# Patient Record
Sex: Male | Born: 1935 | Race: White | Hispanic: No | Smoking: Former smoker
Health system: Southern US, Community
[De-identification: ages and names within clinical notes are randomized; demographics above are authoritative.]

## PROBLEM LIST (undated history)

## (undated) DIAGNOSIS — H409 Unspecified glaucoma: Secondary | ICD-10-CM

## (undated) DIAGNOSIS — F32A Depression, unspecified: Secondary | ICD-10-CM

## (undated) DIAGNOSIS — I10 Essential (primary) hypertension: Secondary | ICD-10-CM

## (undated) DIAGNOSIS — E119 Type 2 diabetes mellitus without complications: Secondary | ICD-10-CM

## (undated) DIAGNOSIS — N400 Enlarged prostate without lower urinary tract symptoms: Secondary | ICD-10-CM

## (undated) DIAGNOSIS — D638 Anemia in other chronic diseases classified elsewhere: Secondary | ICD-10-CM

## (undated) DIAGNOSIS — R32 Unspecified urinary incontinence: Secondary | ICD-10-CM

## (undated) DIAGNOSIS — F329 Major depressive disorder, single episode, unspecified: Secondary | ICD-10-CM

## (undated) DIAGNOSIS — E785 Hyperlipidemia, unspecified: Secondary | ICD-10-CM

## (undated) DIAGNOSIS — E663 Overweight: Secondary | ICD-10-CM

## (undated) DIAGNOSIS — J189 Pneumonia, unspecified organism: Secondary | ICD-10-CM

## (undated) DIAGNOSIS — I509 Heart failure, unspecified: Secondary | ICD-10-CM

## (undated) DIAGNOSIS — R918 Other nonspecific abnormal finding of lung field: Secondary | ICD-10-CM

## (undated) DIAGNOSIS — C61 Malignant neoplasm of prostate: Secondary | ICD-10-CM

## (undated) DIAGNOSIS — I2510 Atherosclerotic heart disease of native coronary artery without angina pectoris: Secondary | ICD-10-CM

## (undated) DIAGNOSIS — I4891 Unspecified atrial fibrillation: Secondary | ICD-10-CM

## (undated) DIAGNOSIS — E039 Hypothyroidism, unspecified: Secondary | ICD-10-CM

## (undated) HISTORY — DX: Type 2 diabetes mellitus without complications: E11.9

## (undated) HISTORY — PX: REPLACEMENT TOTAL KNEE: SUR1224

## (undated) HISTORY — PX: PENILE PROSTHESIS PLACEMENT: SHX739

## (undated) HISTORY — DX: Unspecified urinary incontinence: R32

## (undated) HISTORY — DX: Overweight: E66.3

## (undated) HISTORY — PX: OTHER SURGICAL HISTORY: SHX169

## (undated) HISTORY — DX: Benign prostatic hyperplasia without lower urinary tract symptoms: N40.0

## (undated) HISTORY — PX: PENILE PROSTHESIS  REMOVAL: SHX2202

---

## 2003-07-29 ENCOUNTER — Other Ambulatory Visit: Payer: Self-pay

## 2004-11-15 ENCOUNTER — Ambulatory Visit: Payer: Self-pay | Admitting: Orthopedic Surgery

## 2004-12-10 ENCOUNTER — Emergency Department: Payer: Self-pay | Admitting: Emergency Medicine

## 2004-12-26 ENCOUNTER — Encounter: Payer: Self-pay | Admitting: Orthopaedic Surgery

## 2005-01-21 ENCOUNTER — Encounter: Payer: Self-pay | Admitting: Orthopaedic Surgery

## 2005-12-11 ENCOUNTER — Inpatient Hospital Stay: Payer: Self-pay | Admitting: Internal Medicine

## 2005-12-11 ENCOUNTER — Other Ambulatory Visit: Payer: Self-pay

## 2006-02-20 ENCOUNTER — Encounter: Payer: Self-pay | Admitting: Orthopedic Surgery

## 2006-02-21 ENCOUNTER — Encounter: Payer: Self-pay | Admitting: Orthopedic Surgery

## 2006-03-29 ENCOUNTER — Emergency Department: Payer: Self-pay | Admitting: Emergency Medicine

## 2006-04-12 ENCOUNTER — Encounter: Payer: Self-pay | Admitting: Orthopedic Surgery

## 2006-04-23 ENCOUNTER — Encounter: Payer: Self-pay | Admitting: Orthopedic Surgery

## 2006-05-24 ENCOUNTER — Encounter: Payer: Self-pay | Admitting: Orthopedic Surgery

## 2006-06-22 ENCOUNTER — Encounter: Payer: Self-pay | Admitting: Orthopedic Surgery

## 2007-11-12 ENCOUNTER — Ambulatory Visit: Payer: Self-pay | Admitting: Pain Medicine

## 2007-12-22 ENCOUNTER — Ambulatory Visit: Payer: Self-pay | Admitting: Pain Medicine

## 2007-12-30 ENCOUNTER — Ambulatory Visit: Payer: Self-pay | Admitting: Pain Medicine

## 2008-01-19 ENCOUNTER — Ambulatory Visit: Payer: Self-pay | Admitting: Pain Medicine

## 2008-02-26 ENCOUNTER — Encounter: Payer: Self-pay | Admitting: Internal Medicine

## 2008-03-02 ENCOUNTER — Ambulatory Visit: Payer: Self-pay | Admitting: Pain Medicine

## 2008-08-09 ENCOUNTER — Ambulatory Visit: Payer: Self-pay | Admitting: Pain Medicine

## 2008-08-24 ENCOUNTER — Ambulatory Visit: Payer: Self-pay | Admitting: Pain Medicine

## 2009-08-29 ENCOUNTER — Ambulatory Visit: Payer: Self-pay | Admitting: Specialist

## 2009-08-30 ENCOUNTER — Ambulatory Visit: Payer: Self-pay | Admitting: Pain Medicine

## 2009-09-07 ENCOUNTER — Inpatient Hospital Stay: Payer: Self-pay | Admitting: Specialist

## 2009-09-13 ENCOUNTER — Encounter: Payer: Self-pay | Admitting: Internal Medicine

## 2009-09-21 ENCOUNTER — Encounter: Payer: Self-pay | Admitting: Internal Medicine

## 2009-10-04 ENCOUNTER — Encounter: Payer: Self-pay | Admitting: Specialist

## 2009-10-21 ENCOUNTER — Encounter: Payer: Self-pay | Admitting: Specialist

## 2009-10-21 ENCOUNTER — Encounter: Payer: Self-pay | Admitting: Internal Medicine

## 2009-10-31 ENCOUNTER — Inpatient Hospital Stay: Payer: Self-pay | Admitting: Internal Medicine

## 2009-11-21 ENCOUNTER — Encounter: Payer: Self-pay | Admitting: Internal Medicine

## 2009-12-22 ENCOUNTER — Encounter: Payer: Self-pay | Admitting: Internal Medicine

## 2010-01-06 ENCOUNTER — Inpatient Hospital Stay: Payer: Self-pay | Admitting: Internal Medicine

## 2010-01-17 ENCOUNTER — Ambulatory Visit: Payer: Self-pay

## 2010-01-21 ENCOUNTER — Encounter: Payer: Self-pay | Admitting: Internal Medicine

## 2010-02-02 ENCOUNTER — Ambulatory Visit: Payer: Self-pay

## 2010-02-21 ENCOUNTER — Encounter: Payer: Self-pay | Admitting: Internal Medicine

## 2010-03-23 ENCOUNTER — Encounter: Payer: Self-pay | Admitting: Internal Medicine

## 2010-04-23 ENCOUNTER — Encounter: Payer: Self-pay | Admitting: Internal Medicine

## 2010-05-24 ENCOUNTER — Encounter: Payer: Self-pay | Admitting: Internal Medicine

## 2011-07-27 ENCOUNTER — Ambulatory Visit: Payer: Self-pay | Admitting: Oncology

## 2011-07-27 LAB — RETICULOCYTES
Absolute Retic Count: 0.038 10*6/uL (ref 0.024–0.084)
Reticulocyte: 1.2 % (ref 0.5–1.5)

## 2011-07-27 LAB — IRON AND TIBC
Iron Bind.Cap.(Total): 297 ug/dL (ref 250–450)
Iron: 91 ug/dL (ref 65–175)
Unbound Iron-Bind.Cap.: 206 ug/dL

## 2011-07-27 LAB — CBC CANCER CENTER
Eosinophil %: 6.6 %
HCT: 29.4 % — ABNORMAL LOW (ref 40.0–52.0)
HGB: 10.2 g/dL — ABNORMAL LOW (ref 13.0–18.0)
Lymphocyte #: 1.8 x10 3/mm (ref 1.0–3.6)
MCHC: 34.7 g/dL (ref 32.0–36.0)
MCV: 97 fL (ref 80–100)
Monocyte #: 0.1 x10 3/mm (ref 0.0–0.7)
Neutrophil #: 1.6 x10 3/mm (ref 1.4–6.5)
Platelet: 198 x10 3/mm (ref 150–440)
RDW: 14.7 % — ABNORMAL HIGH (ref 11.5–14.5)
WBC: 3.8 x10 3/mm (ref 3.8–10.6)

## 2011-07-27 LAB — FOLATE: Folic Acid: 15.9 ng/mL (ref 3.1–100.0)

## 2011-07-27 LAB — FERRITIN: Ferritin (ARMC): 275 ng/mL (ref 8–388)

## 2011-07-27 LAB — LACTATE DEHYDROGENASE: LDH: 147 U/L (ref 87–241)

## 2011-08-17 LAB — CBC CANCER CENTER
Basophil #: 0 x10 3/mm (ref 0.0–0.1)
Basophil %: 0.5 %
HCT: 30.7 % — ABNORMAL LOW (ref 40.0–52.0)
HGB: 10.4 g/dL — ABNORMAL LOW (ref 13.0–18.0)
Lymphocyte #: 2.4 x10 3/mm (ref 1.0–3.6)
Lymphocyte %: 63.6 %
Monocyte #: 0.2 x10 3/mm (ref 0.2–1.0)
Monocyte %: 4.3 %
Neutrophil %: 27.8 %
Platelet: 147 x10 3/mm — ABNORMAL LOW (ref 150–440)
RDW: 14.9 % — ABNORMAL HIGH (ref 11.5–14.5)
WBC: 3.7 x10 3/mm — ABNORMAL LOW (ref 3.8–10.6)

## 2011-08-22 ENCOUNTER — Ambulatory Visit: Payer: Self-pay | Admitting: Oncology

## 2011-12-05 ENCOUNTER — Ambulatory Visit: Payer: Self-pay | Admitting: Pain Medicine

## 2012-01-03 ENCOUNTER — Ambulatory Visit: Payer: Self-pay | Admitting: Oncology

## 2012-01-03 LAB — CBC CANCER CENTER
Basophil #: 0 x10 3/mm (ref 0.0–0.1)
HCT: 30.2 % — ABNORMAL LOW (ref 40.0–52.0)
Lymphocyte #: 2.2 x10 3/mm (ref 1.0–3.6)
MCH: 33.7 pg (ref 26.0–34.0)
MCV: 100 fL (ref 80–100)
Monocyte #: 0.2 x10 3/mm (ref 0.2–1.0)
Monocyte %: 5 %
Platelet: 174 x10 3/mm (ref 150–440)
RDW: 13.6 % (ref 11.5–14.5)

## 2012-01-22 ENCOUNTER — Ambulatory Visit: Payer: Self-pay | Admitting: Oncology

## 2012-04-23 ENCOUNTER — Ambulatory Visit: Payer: Self-pay | Admitting: Oncology

## 2012-04-30 LAB — COMPREHENSIVE METABOLIC PANEL
Albumin: 3.2 g/dL — ABNORMAL LOW (ref 3.4–5.0)
Alkaline Phosphatase: 151 U/L — ABNORMAL HIGH (ref 50–136)
Co2: 24 mmol/L (ref 21–32)
Osmolality: 275 (ref 275–301)
Potassium: 4.6 mmol/L (ref 3.5–5.1)
SGOT(AST): 27 U/L (ref 15–37)
Total Protein: 7.4 g/dL (ref 6.4–8.2)

## 2012-04-30 LAB — CBC WITH DIFFERENTIAL/PLATELET
Basophil #: 0.1 10*3/uL (ref 0.0–0.1)
Basophil %: 2.9 %
Eosinophil #: 0.2 10*3/uL (ref 0.0–0.7)
Eosinophil %: 4.1 %
MCHC: 33.2 g/dL (ref 32.0–36.0)
MCV: 100 fL (ref 80–100)
Monocyte #: 0.2 x10 3/mm (ref 0.2–1.0)
RBC: 2.44 10*6/uL — ABNORMAL LOW (ref 4.40–5.90)

## 2012-04-30 LAB — CK TOTAL AND CKMB (NOT AT ARMC): CK, Total: 30 U/L — ABNORMAL LOW (ref 35–232)

## 2012-04-30 LAB — TROPONIN I: Troponin-I: <0.02 ng/mL

## 2012-05-01 ENCOUNTER — Observation Stay: Payer: Self-pay | Admitting: Student

## 2012-05-01 LAB — RETICULOCYTES: Reticulocyte: 1.8 % (ref 0.7–2.5)

## 2012-05-01 LAB — IRON AND TIBC
Iron Bind.Cap.(Total): 251 ug/dL (ref 250–450)
Iron Saturation: 10 %
Iron: 25 ug/dL — ABNORMAL LOW (ref 65–175)
Unbound Iron-Bind.Cap.: 226 ug/dL

## 2012-05-01 LAB — OCCULT BLOOD X 1 CARD TO LAB, STOOL: Occult Blood, Feces: NEGATIVE

## 2012-05-02 LAB — PSA: PSA: <0.1 ng/mL (ref 0.0–4.0)

## 2012-05-08 ENCOUNTER — Inpatient Hospital Stay: Payer: Self-pay | Admitting: Internal Medicine

## 2012-05-08 LAB — FOLATE: Folic Acid: 13.9 ng/mL (ref 3.1–100.0)

## 2012-05-08 LAB — CBC WITH DIFFERENTIAL/PLATELET
Basophil %: 0.8 %
Eosinophil %: 6.9 %
HGB: 7.4 g/dL — ABNORMAL LOW (ref 13.0–18.0)
Lymphocyte #: 1.5 10*3/uL (ref 1.0–3.6)
MCV: 100 fL (ref 80–100)
Monocyte %: 4.1 %
Neutrophil #: 2.2 10*3/uL (ref 1.4–6.5)
Neutrophil %: 52.5 %
Platelet: 179 10*3/uL (ref 150–440)
RBC: 2.16 10*6/uL — ABNORMAL LOW (ref 4.40–5.90)
RDW: 13.3 % (ref 11.5–14.5)
WBC: 4.2 10*3/uL (ref 3.8–10.6)

## 2012-05-08 LAB — URINALYSIS, COMPLETE
Bacteria: NONE SEEN
Blood: NEGATIVE
Ketone: NEGATIVE
Nitrite: NEGATIVE
Protein: NEGATIVE
Squamous Epithelial: NONE SEEN
WBC UR: <1 /HPF (ref 0–5)

## 2012-05-08 LAB — PROTIME-INR: INR: 1.2

## 2012-05-08 LAB — BASIC METABOLIC PANEL
Anion Gap: 7 (ref 7–16)
Calcium, Total: 8.5 mg/dL (ref 8.5–10.1)
Chloride: 104 mmol/L (ref 98–107)
Co2: 26 mmol/L (ref 21–32)
Creatinine: 1.33 mg/dL — ABNORMAL HIGH (ref 0.60–1.30)
EGFR (African American): 55 — ABNORMAL LOW
Glucose: 109 mg/dL — ABNORMAL HIGH (ref 65–99)
Osmolality: 280 (ref 275–301)
Potassium: 4.5 mmol/L (ref 3.5–5.1)

## 2012-05-08 LAB — RETICULOCYTES: Reticulocyte: 1.6 % (ref 0.7–2.5)

## 2012-05-08 LAB — IRON AND TIBC
Iron Bind.Cap.(Total): 207 ug/dL — ABNORMAL LOW (ref 250–450)
Iron Saturation: 11 %
Unbound Iron-Bind.Cap.: 185 ug/dL

## 2012-05-08 LAB — LACTATE DEHYDROGENASE: LDH: 146 U/L (ref 85–241)

## 2012-05-09 LAB — CBC WITH DIFFERENTIAL/PLATELET
Basophil #: 0 10*3/uL (ref 0.0–0.1)
Basophil %: 0.6 %
Eosinophil #: 0.2 10*3/uL (ref 0.0–0.7)
Eosinophil %: 6.6 %
HGB: 8.5 g/dL — ABNORMAL LOW (ref 13.0–18.0)
Lymphocyte %: 33.6 %
MCH: 34.3 pg — ABNORMAL HIGH (ref 26.0–34.0)
MCHC: 34.9 g/dL (ref 32.0–36.0)
MCV: 98 fL (ref 80–100)
Monocyte #: 0.2 x10 3/mm (ref 0.2–1.0)
Monocyte %: 4.7 %
Neutrophil %: 54.5 %
Platelet: 180 10*3/uL (ref 150–440)
RBC: 2.47 10*6/uL — ABNORMAL LOW (ref 4.40–5.90)

## 2012-05-09 LAB — BASIC METABOLIC PANEL
Anion Gap: 8 (ref 7–16)
Calcium, Total: 8.6 mg/dL (ref 8.5–10.1)
Co2: 24 mmol/L (ref 21–32)
Creatinine: 1.13 mg/dL (ref 0.60–1.30)
EGFR (African American): >60
Glucose: 106 mg/dL — ABNORMAL HIGH (ref 65–99)
Osmolality: 273 (ref 275–301)
Potassium: 4.1 mmol/L (ref 3.5–5.1)
Sodium: 134 mmol/L — ABNORMAL LOW (ref 136–145)

## 2012-05-09 LAB — APTT: Activated PTT: 42.3 secs — ABNORMAL HIGH (ref 23.6–35.9)

## 2012-05-19 LAB — CBC CANCER CENTER
Basophil #: 0 x10 3/mm (ref 0.0–0.1)
Basophil %: 0.5 %
Eosinophil %: 3.3 %
HCT: 26.4 % — ABNORMAL LOW (ref 40.0–52.0)
Lymphocyte #: 1 x10 3/mm (ref 1.0–3.6)
Lymphocyte %: 24 %
MCV: 97 fL (ref 80–100)
Monocyte #: 0.2 x10 3/mm (ref 0.2–1.0)
Neutrophil %: 68.2 %
Platelet: 221 x10 3/mm (ref 150–440)
RDW: 14.2 % (ref 11.5–14.5)
WBC: 4.4 x10 3/mm (ref 3.8–10.6)

## 2012-05-19 LAB — BASIC METABOLIC PANEL
Anion Gap: 6 — ABNORMAL LOW (ref 7–16)
Calcium, Total: 8.8 mg/dL (ref 8.5–10.1)
Chloride: 97 mmol/L — ABNORMAL LOW (ref 98–107)
Co2: 29 mmol/L (ref 21–32)
Glucose: 126 mg/dL — ABNORMAL HIGH (ref 65–99)
Potassium: 4.1 mmol/L (ref 3.5–5.1)
Sodium: 132 mmol/L — ABNORMAL LOW (ref 136–145)

## 2012-05-19 LAB — IRON AND TIBC
Iron Bind.Cap.(Total): 202 ug/dL — ABNORMAL LOW (ref 250–450)
Iron Saturation: 10 %
Unbound Iron-Bind.Cap.: 182 ug/dL

## 2012-05-19 LAB — FERRITIN: Ferritin (ARMC): 718 ng/mL — ABNORMAL HIGH (ref 8–388)

## 2012-05-24 ENCOUNTER — Ambulatory Visit: Payer: Self-pay | Admitting: Oncology

## 2012-06-02 LAB — BASIC METABOLIC PANEL
Anion Gap: 9 (ref 7–16)
BUN: 23 mg/dL — ABNORMAL HIGH (ref 7–18)
Calcium, Total: 8.6 mg/dL (ref 8.5–10.1)
Chloride: 96 mmol/L — ABNORMAL LOW (ref 98–107)
Co2: 26 mmol/L (ref 21–32)
EGFR (African American): 58 — ABNORMAL LOW
Glucose: 100 mg/dL — ABNORMAL HIGH (ref 65–99)
Osmolality: 266 (ref 275–301)

## 2012-06-02 LAB — CBC CANCER CENTER
Basophil #: 0 x10 3/mm (ref 0.0–0.1)
Basophil %: 0.9 %
Eosinophil #: 0.2 x10 3/mm (ref 0.0–0.7)
Eosinophil %: 6.5 %
HCT: 24.3 % — ABNORMAL LOW (ref 40.0–52.0)
Lymphocyte %: 25.7 %
MCHC: 34.4 g/dL (ref 32.0–36.0)
Monocyte #: 0.2 x10 3/mm (ref 0.2–1.0)
Monocyte %: 5.3 %
Neutrophil %: 61.6 %
Platelet: 141 x10 3/mm — ABNORMAL LOW (ref 150–440)
RDW: 14.7 % — ABNORMAL HIGH (ref 11.5–14.5)
WBC: 3.6 x10 3/mm — ABNORMAL LOW (ref 3.8–10.6)

## 2012-06-21 ENCOUNTER — Ambulatory Visit: Payer: Self-pay | Admitting: Oncology

## 2012-06-30 LAB — CBC CANCER CENTER
Basophil #: 0 x10 3/mm (ref 0.0–0.1)
Basophil %: 0.2 %
Eosinophil #: 0 x10 3/mm (ref 0.0–0.7)
Eosinophil %: 0 %
HGB: 9.9 g/dL — ABNORMAL LOW (ref 13.0–18.0)
Lymphocyte #: 0.5 x10 3/mm — ABNORMAL LOW (ref 1.0–3.6)
MCH: 35.3 pg — ABNORMAL HIGH (ref 26.0–34.0)
MCHC: 33.8 g/dL (ref 32.0–36.0)
MCV: 104 fL — ABNORMAL HIGH (ref 80–100)
Monocyte #: 0.1 x10 3/mm — ABNORMAL LOW (ref 0.2–1.0)
Monocyte %: 2.2 %
Neutrophil #: 2.4 x10 3/mm (ref 1.4–6.5)
Neutrophil %: 80.8 %
Platelet: 158 x10 3/mm (ref 150–440)

## 2012-06-30 LAB — IRON AND TIBC
Iron Bind.Cap.(Total): 290 ug/dL (ref 250–450)
Unbound Iron-Bind.Cap.: 111 ug/dL

## 2012-06-30 LAB — FERRITIN: Ferritin (ARMC): 1179 ng/mL — ABNORMAL HIGH (ref 8–388)

## 2012-07-22 ENCOUNTER — Ambulatory Visit: Payer: Self-pay | Admitting: Oncology

## 2012-08-29 ENCOUNTER — Ambulatory Visit: Payer: Self-pay | Admitting: Oncology

## 2012-09-01 LAB — CBC CANCER CENTER
Basophil #: 0 x10 3/mm (ref 0.0–0.1)
Eosinophil #: 0 x10 3/mm (ref 0.0–0.7)
HGB: 10.2 g/dL — ABNORMAL LOW (ref 13.0–18.0)
MCH: 39.8 pg — ABNORMAL HIGH (ref 26.0–34.0)
MCHC: 34.9 g/dL (ref 32.0–36.0)
MCV: 114 fL — ABNORMAL HIGH (ref 80–100)
Neutrophil #: 2 x10 3/mm (ref 1.4–6.5)
Neutrophil %: 77.2 %
Platelet: 163 x10 3/mm (ref 150–440)
RBC: 2.56 10*6/uL — ABNORMAL LOW (ref 4.40–5.90)
RDW: 15.8 % — ABNORMAL HIGH (ref 11.5–14.5)
WBC: 2.5 x10 3/mm — ABNORMAL LOW (ref 3.8–10.6)

## 2012-09-01 LAB — IRON AND TIBC
Iron Bind.Cap.(Total): 302 ug/dL (ref 250–450)
Iron Saturation: 35 %
Unbound Iron-Bind.Cap.: 195 ug/dL

## 2012-09-01 LAB — FERRITIN: Ferritin (ARMC): 817 ng/mL — ABNORMAL HIGH (ref 8–388)

## 2012-09-21 ENCOUNTER — Ambulatory Visit: Payer: Self-pay | Admitting: Oncology

## 2012-11-21 ENCOUNTER — Ambulatory Visit: Payer: Self-pay | Admitting: Oncology

## 2012-12-02 LAB — CBC CANCER CENTER
Basophil #: 0 x10 3/mm (ref 0.0–0.1)
Basophil %: 0.7 %
Eosinophil #: 0 x10 3/mm (ref 0.0–0.7)
HGB: 11.1 g/dL — ABNORMAL LOW (ref 13.0–18.0)
Lymphocyte #: 0.6 x10 3/mm — ABNORMAL LOW (ref 1.0–3.6)
MCH: 40.1 pg — ABNORMAL HIGH (ref 26.0–34.0)
MCHC: 35.8 g/dL (ref 32.0–36.0)
Monocyte #: 0.1 x10 3/mm — ABNORMAL LOW (ref 0.2–1.0)
Neutrophil %: 83.8 %
Platelet: 199 x10 3/mm (ref 150–440)
RBC: 2.76 10*6/uL — ABNORMAL LOW (ref 4.40–5.90)
RDW: 13.5 % (ref 11.5–14.5)
WBC: 4.3 x10 3/mm (ref 3.8–10.6)

## 2012-12-02 LAB — IRON AND TIBC
Iron Bind.Cap.(Total): 282 ug/dL (ref 250–450)
Iron: 125 ug/dL (ref 65–175)

## 2012-12-03 ENCOUNTER — Ambulatory Visit: Payer: Self-pay | Admitting: Oncology

## 2012-12-22 ENCOUNTER — Ambulatory Visit: Payer: Self-pay | Admitting: Oncology

## 2013-02-04 ENCOUNTER — Ambulatory Visit: Payer: Self-pay | Admitting: Pain Medicine

## 2013-02-12 ENCOUNTER — Ambulatory Visit: Payer: Self-pay | Admitting: Pain Medicine

## 2013-03-23 ENCOUNTER — Ambulatory Visit: Payer: Self-pay | Admitting: Oncology

## 2013-03-23 LAB — CBC CANCER CENTER
Basophil #: 0 x10 3/mm (ref 0.0–0.1)
Basophil %: 0.7 %
Eosinophil #: 0 x10 3/mm (ref 0.0–0.7)
HCT: 33 % — ABNORMAL LOW (ref 40.0–52.0)
Lymphocyte %: 19.3 %
MCH: 37.2 pg — ABNORMAL HIGH (ref 26.0–34.0)
MCHC: 34.3 g/dL (ref 32.0–36.0)
Monocyte #: 0.1 x10 3/mm — ABNORMAL LOW (ref 0.2–1.0)
Neutrophil #: 2.7 x10 3/mm (ref 1.4–6.5)
Neutrophil %: 77.6 %
Platelet: 179 x10 3/mm (ref 150–440)
RBC: 3.05 10*6/uL — ABNORMAL LOW (ref 4.40–5.90)
RDW: 13.8 % (ref 11.5–14.5)
WBC: 3.5 x10 3/mm — ABNORMAL LOW (ref 3.8–10.6)

## 2013-03-23 LAB — HEPATIC FUNCTION PANEL A (ARMC)
Albumin: 3.3 g/dL — ABNORMAL LOW (ref 3.4–5.0)
Alkaline Phosphatase: 54 U/L
Bilirubin, Direct: 0.1 mg/dL (ref 0.00–0.20)
SGOT(AST): 13 U/L — ABNORMAL LOW (ref 15–37)
SGPT (ALT): 21 U/L (ref 12–78)
Total Protein: 6.5 g/dL (ref 6.4–8.2)

## 2013-03-23 LAB — FERRITIN: Ferritin (ARMC): 649 ng/mL — ABNORMAL HIGH (ref 8–388)

## 2013-04-23 ENCOUNTER — Ambulatory Visit: Payer: Self-pay | Admitting: Oncology

## 2013-07-22 ENCOUNTER — Emergency Department: Payer: Self-pay | Admitting: Emergency Medicine

## 2013-07-22 LAB — COMPREHENSIVE METABOLIC PANEL
ALK PHOS: 57 U/L
ANION GAP: 4 — AB (ref 7–16)
AST: 21 U/L (ref 15–37)
Albumin: 3.1 g/dL — ABNORMAL LOW (ref 3.4–5.0)
BILIRUBIN TOTAL: 0.6 mg/dL (ref 0.2–1.0)
BUN: 20 mg/dL — ABNORMAL HIGH (ref 7–18)
Calcium, Total: 8.8 mg/dL (ref 8.5–10.1)
Chloride: 102 mmol/L (ref 98–107)
Co2: 32 mmol/L (ref 21–32)
Creatinine: 1.05 mg/dL (ref 0.60–1.30)
EGFR (African American): >60
EGFR (Non-African Amer.): >60
Glucose: 130 mg/dL — ABNORMAL HIGH (ref 65–99)
Osmolality: 280 (ref 275–301)
Potassium: 3.8 mmol/L (ref 3.5–5.1)
SGPT (ALT): 21 U/L (ref 12–78)
Sodium: 138 mmol/L (ref 136–145)
Total Protein: 6.8 g/dL (ref 6.4–8.2)

## 2013-07-22 LAB — CBC
HCT: 32.1 % — ABNORMAL LOW (ref 40.0–52.0)
HGB: 10.8 g/dL — AB (ref 13.0–18.0)
MCH: 38.1 pg — AB (ref 26.0–34.0)
MCHC: 33.6 g/dL (ref 32.0–36.0)
MCV: 113 fL — AB (ref 80–100)
PLATELETS: 142 10*3/uL — AB (ref 150–440)
RBC: 2.83 10*6/uL — ABNORMAL LOW (ref 4.40–5.90)
RDW: 15 % — ABNORMAL HIGH (ref 11.5–14.5)
WBC: 3.7 10*3/uL — ABNORMAL LOW (ref 3.8–10.6)

## 2013-07-22 LAB — URINALYSIS, COMPLETE
BLOOD: NEGATIVE
Bacteria: NONE SEEN
Bilirubin,UR: NEGATIVE
Glucose,UR: NEGATIVE mg/dL (ref 0–75)
Ketone: NEGATIVE
Leukocyte Esterase: NEGATIVE
NITRITE: NEGATIVE
PH: 6 (ref 4.5–8.0)
Protein: NEGATIVE
SQUAMOUS EPITHELIAL: NONE SEEN
Specific Gravity: 1.016 (ref 1.003–1.030)
WBC UR: NONE SEEN /HPF (ref 0–5)

## 2013-07-22 LAB — CK TOTAL AND CKMB (NOT AT ARMC)
CK, TOTAL: 21 U/L — AB
CK-MB: <0.5 ng/mL — ABNORMAL LOW (ref 0.5–3.6)

## 2013-07-22 LAB — PROTIME-INR
INR: 1
Prothrombin Time: 13.3 secs (ref 11.5–14.7)

## 2013-07-22 LAB — TROPONIN I: Troponin-I: <0.02 ng/mL

## 2013-09-21 ENCOUNTER — Ambulatory Visit: Payer: Self-pay | Admitting: Internal Medicine

## 2013-10-01 ENCOUNTER — Inpatient Hospital Stay: Payer: Self-pay | Admitting: Internal Medicine

## 2013-10-01 LAB — COMPREHENSIVE METABOLIC PANEL
ALBUMIN: 3.1 g/dL — AB (ref 3.4–5.0)
ALK PHOS: 60 U/L
ALT: 18 U/L (ref 12–78)
Anion Gap: 8 (ref 7–16)
BILIRUBIN TOTAL: 1 mg/dL (ref 0.2–1.0)
BUN: 19 mg/dL — AB (ref 7–18)
CALCIUM: 9 mg/dL (ref 8.5–10.1)
CHLORIDE: 96 mmol/L — AB (ref 98–107)
CREATININE: 0.99 mg/dL (ref 0.60–1.30)
Co2: 28 mmol/L (ref 21–32)
GLUCOSE: 147 mg/dL — AB (ref 65–99)
Osmolality: 269 (ref 275–301)
Potassium: 3.4 mmol/L — ABNORMAL LOW (ref 3.5–5.1)
SGOT(AST): 16 U/L (ref 15–37)
Sodium: 132 mmol/L — ABNORMAL LOW (ref 136–145)
TOTAL PROTEIN: 6.7 g/dL (ref 6.4–8.2)

## 2013-10-01 LAB — URINALYSIS, COMPLETE
Bacteria: NONE SEEN
Bilirubin,UR: NEGATIVE
Blood: NEGATIVE
Glucose,UR: NEGATIVE mg/dL (ref 0–75)
KETONE: NEGATIVE
LEUKOCYTE ESTERASE: NEGATIVE
Nitrite: NEGATIVE
PROTEIN: NEGATIVE
Ph: 7 (ref 4.5–8.0)
RBC,UR: 3 /HPF (ref 0–5)
Specific Gravity: 1.015 (ref 1.003–1.030)
Squamous Epithelial: <1
WBC UR: <1 /HPF (ref 0–5)

## 2013-10-01 LAB — CBC
HCT: 29.4 % — AB (ref 40.0–52.0)
HGB: 10.1 g/dL — ABNORMAL LOW (ref 13.0–18.0)
MCH: 39.3 pg — ABNORMAL HIGH (ref 26.0–34.0)
MCHC: 34.3 g/dL (ref 32.0–36.0)
MCV: 115 fL — ABNORMAL HIGH (ref 80–100)
Platelet: 151 10*3/uL (ref 150–440)
RBC: 2.57 10*6/uL — ABNORMAL LOW (ref 4.40–5.90)
RDW: 15.9 % — ABNORMAL HIGH (ref 11.5–14.5)
WBC: 7.3 10*3/uL (ref 3.8–10.6)

## 2013-10-01 LAB — CK TOTAL AND CKMB (NOT AT ARMC)
CK, Total: 19 U/L — ABNORMAL LOW
CK-MB: <0.5 ng/mL — ABNORMAL LOW (ref 0.5–3.6)

## 2013-10-01 LAB — PRO B NATRIURETIC PEPTIDE: B-Type Natriuretic Peptide: 937 pg/mL — ABNORMAL HIGH (ref 0–450)

## 2013-10-01 LAB — TROPONIN I: Troponin-I: <0.02 ng/mL

## 2013-10-02 LAB — HEMOGLOBIN: HGB: 8.7 g/dL — AB (ref 13.0–18.0)

## 2013-10-02 LAB — CBC WITH DIFFERENTIAL/PLATELET
Basophil #: 0 10*3/uL (ref 0.0–0.1)
Basophil %: 0.4 %
Eosinophil #: 0 10*3/uL (ref 0.0–0.7)
Eosinophil %: 0 %
HCT: 24.3 % — ABNORMAL LOW (ref 40.0–52.0)
HGB: 8.5 g/dL — AB (ref 13.0–18.0)
LYMPHS PCT: 8.2 %
Lymphocyte #: 0.4 10*3/uL — ABNORMAL LOW (ref 1.0–3.6)
MCH: 40 pg — ABNORMAL HIGH (ref 26.0–34.0)
MCHC: 35 g/dL (ref 32.0–36.0)
MCV: 114 fL — ABNORMAL HIGH (ref 80–100)
MONOS PCT: 2.6 %
Monocyte #: 0.1 x10 3/mm — ABNORMAL LOW (ref 0.2–1.0)
NEUTROS PCT: 88.8 %
Neutrophil #: 4.3 10*3/uL (ref 1.4–6.5)
Platelet: 118 10*3/uL — ABNORMAL LOW (ref 150–440)
RBC: 2.13 10*6/uL — ABNORMAL LOW (ref 4.40–5.90)
RDW: 15.6 % — ABNORMAL HIGH (ref 11.5–14.5)
WBC: 4.9 10*3/uL (ref 3.8–10.6)

## 2013-10-02 LAB — BASIC METABOLIC PANEL
Anion Gap: 5 — ABNORMAL LOW (ref 7–16)
BUN: 20 mg/dL — AB (ref 7–18)
Calcium, Total: 8.3 mg/dL — ABNORMAL LOW (ref 8.5–10.1)
Chloride: 98 mmol/L (ref 98–107)
Co2: 29 mmol/L (ref 21–32)
Creatinine: 1.01 mg/dL (ref 0.60–1.30)
EGFR (African American): >60
EGFR (Non-African Amer.): >60
GLUCOSE: 151 mg/dL — AB (ref 65–99)
Osmolality: 270 (ref 275–301)
Potassium: 3.6 mmol/L (ref 3.5–5.1)
SODIUM: 132 mmol/L — AB (ref 136–145)

## 2013-10-02 LAB — PROTIME-INR
INR: 1.2
PROTHROMBIN TIME: 14.6 s (ref 11.5–14.7)

## 2013-10-02 LAB — SEDIMENTATION RATE

## 2013-10-03 LAB — BASIC METABOLIC PANEL
ANION GAP: 7 (ref 7–16)
BUN: 17 mg/dL (ref 7–18)
CHLORIDE: 97 mmol/L — AB (ref 98–107)
CO2: 28 mmol/L (ref 21–32)
Calcium, Total: 8.2 mg/dL — ABNORMAL LOW (ref 8.5–10.1)
Creatinine: 1.02 mg/dL (ref 0.60–1.30)
EGFR (African American): >60
EGFR (Non-African Amer.): >60
Glucose: 181 mg/dL — ABNORMAL HIGH (ref 65–99)
Osmolality: 271 (ref 275–301)
Potassium: 3.8 mmol/L (ref 3.5–5.1)
Sodium: 132 mmol/L — ABNORMAL LOW (ref 136–145)

## 2013-10-03 LAB — CBC WITH DIFFERENTIAL/PLATELET
BASOS ABS: 0 10*3/uL (ref 0.0–0.1)
Basophil %: 0.4 %
Eosinophil #: 0 10*3/uL (ref 0.0–0.7)
Eosinophil %: 0 %
HCT: 24.6 % — ABNORMAL LOW (ref 40.0–52.0)
HGB: 8.1 g/dL — ABNORMAL LOW (ref 13.0–18.0)
LYMPHS PCT: 14.5 %
Lymphocyte #: 0.6 10*3/uL — ABNORMAL LOW (ref 1.0–3.6)
MCH: 38.1 pg — ABNORMAL HIGH (ref 26.0–34.0)
MCHC: 33 g/dL (ref 32.0–36.0)
MCV: 115 fL — ABNORMAL HIGH (ref 80–100)
MONO ABS: 0.1 x10 3/mm — AB (ref 0.2–1.0)
Monocyte %: 2.5 %
NEUTROS PCT: 82.6 %
Neutrophil #: 3.3 10*3/uL (ref 1.4–6.5)
Platelet: 145 10*3/uL — ABNORMAL LOW (ref 150–440)
RBC: 2.14 10*6/uL — ABNORMAL LOW (ref 4.40–5.90)
RDW: 15.2 % — ABNORMAL HIGH (ref 11.5–14.5)
WBC: 4 10*3/uL (ref 3.8–10.6)

## 2013-10-03 LAB — PROTIME-INR
INR: 1.1
Prothrombin Time: 14.4 secs (ref 11.5–14.7)

## 2013-10-04 LAB — HEMOGLOBIN: HGB: 8.3 g/dL — ABNORMAL LOW (ref 13.0–18.0)

## 2013-10-05 LAB — VANCOMYCIN, TROUGH: Vancomycin, Trough: 12 ug/mL (ref 10–20)

## 2013-10-06 LAB — CULTURE, BLOOD (SINGLE)

## 2013-10-06 LAB — CREATININE, SERUM
CREATININE: 1.12 mg/dL (ref 0.60–1.30)
EGFR (Non-African Amer.): 58 — ABNORMAL LOW

## 2013-10-06 LAB — HEMOGLOBIN: HGB: 8.1 g/dL — AB (ref 13.0–18.0)

## 2013-10-06 LAB — OSMOLALITY: Osmolality: 280 mOsm/kg (ref 280–301)

## 2013-10-06 LAB — SODIUM: Sodium: 132 mmol/L — ABNORMAL LOW (ref 136–145)

## 2013-10-21 ENCOUNTER — Ambulatory Visit: Payer: Self-pay | Admitting: Internal Medicine

## 2013-12-08 ENCOUNTER — Encounter: Payer: Self-pay | Admitting: General Surgery

## 2013-12-22 ENCOUNTER — Encounter: Payer: Self-pay | Admitting: General Surgery

## 2014-01-21 ENCOUNTER — Encounter: Payer: Self-pay | Admitting: General Surgery

## 2014-04-24 IMAGING — CR DG CHEST 2V
1 series · 2 of 2 positions shown · non-contrast
Comparison: none

REASON FOR EXAM: sob
COMMENTS:

[Series 1: x chest ap · 0.14mm/px · 2 of 2 slices shown]
[im 1/2]
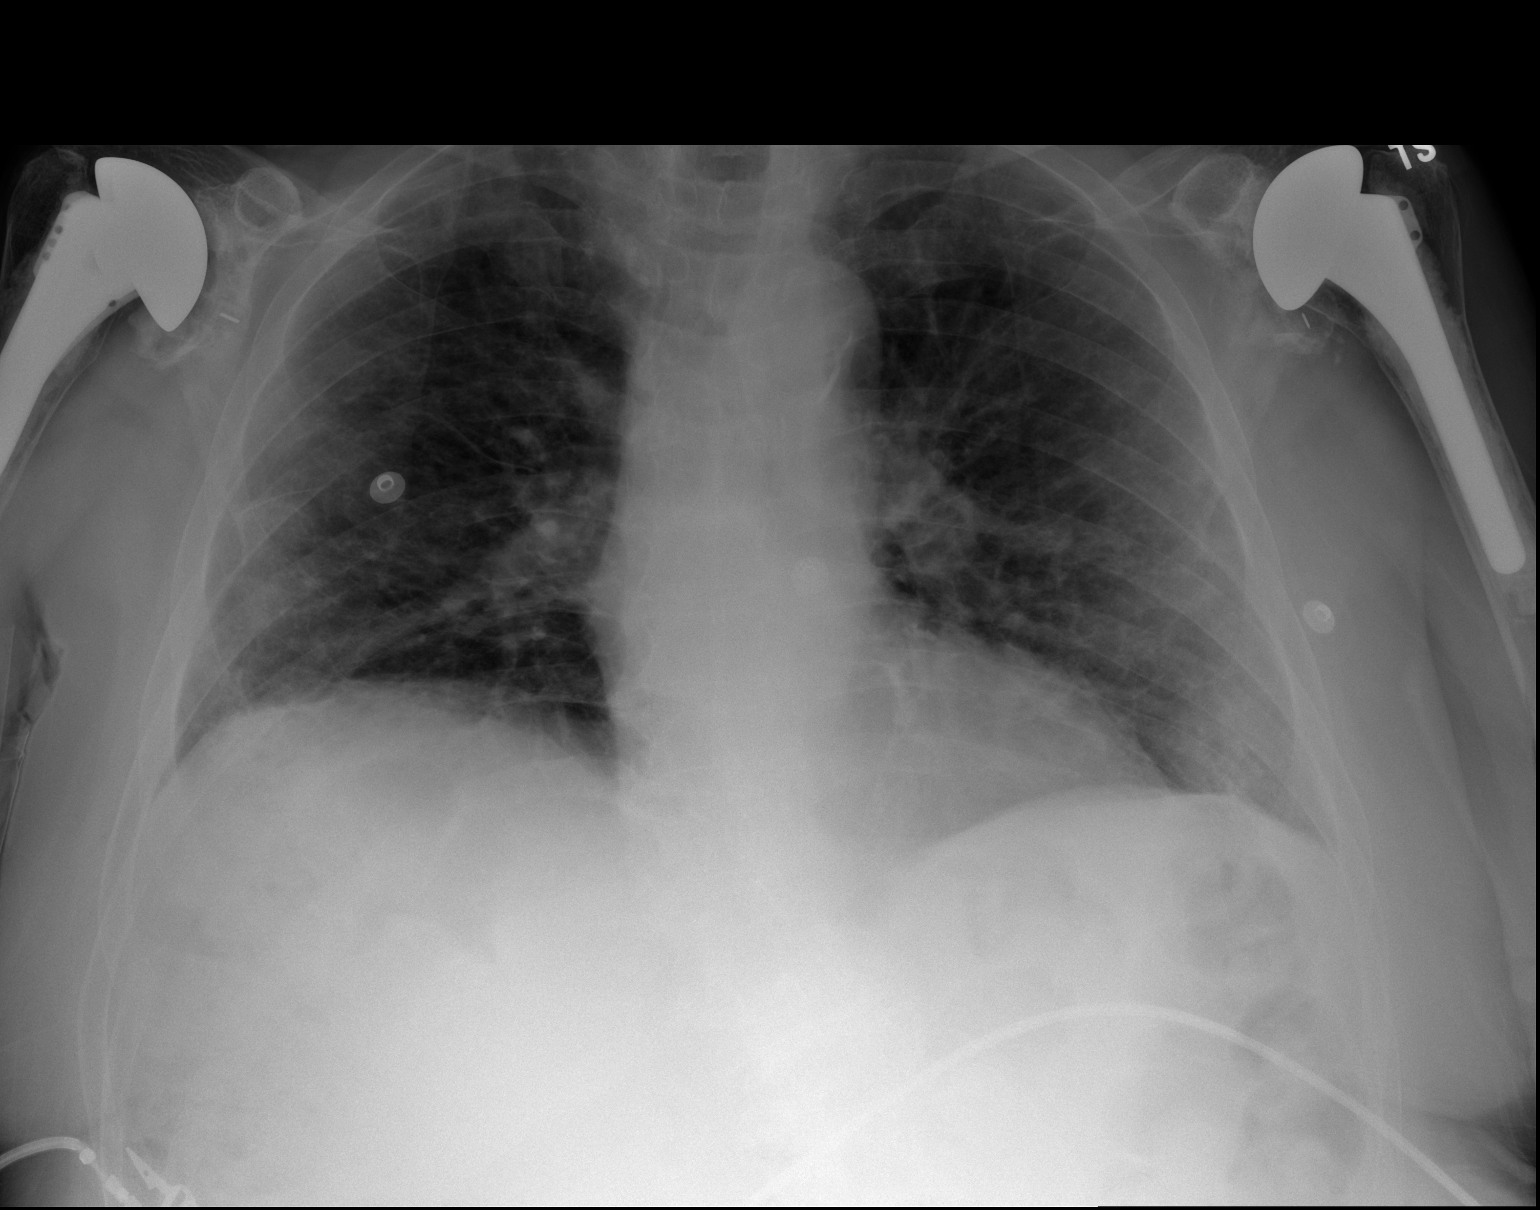
[im 2/2]
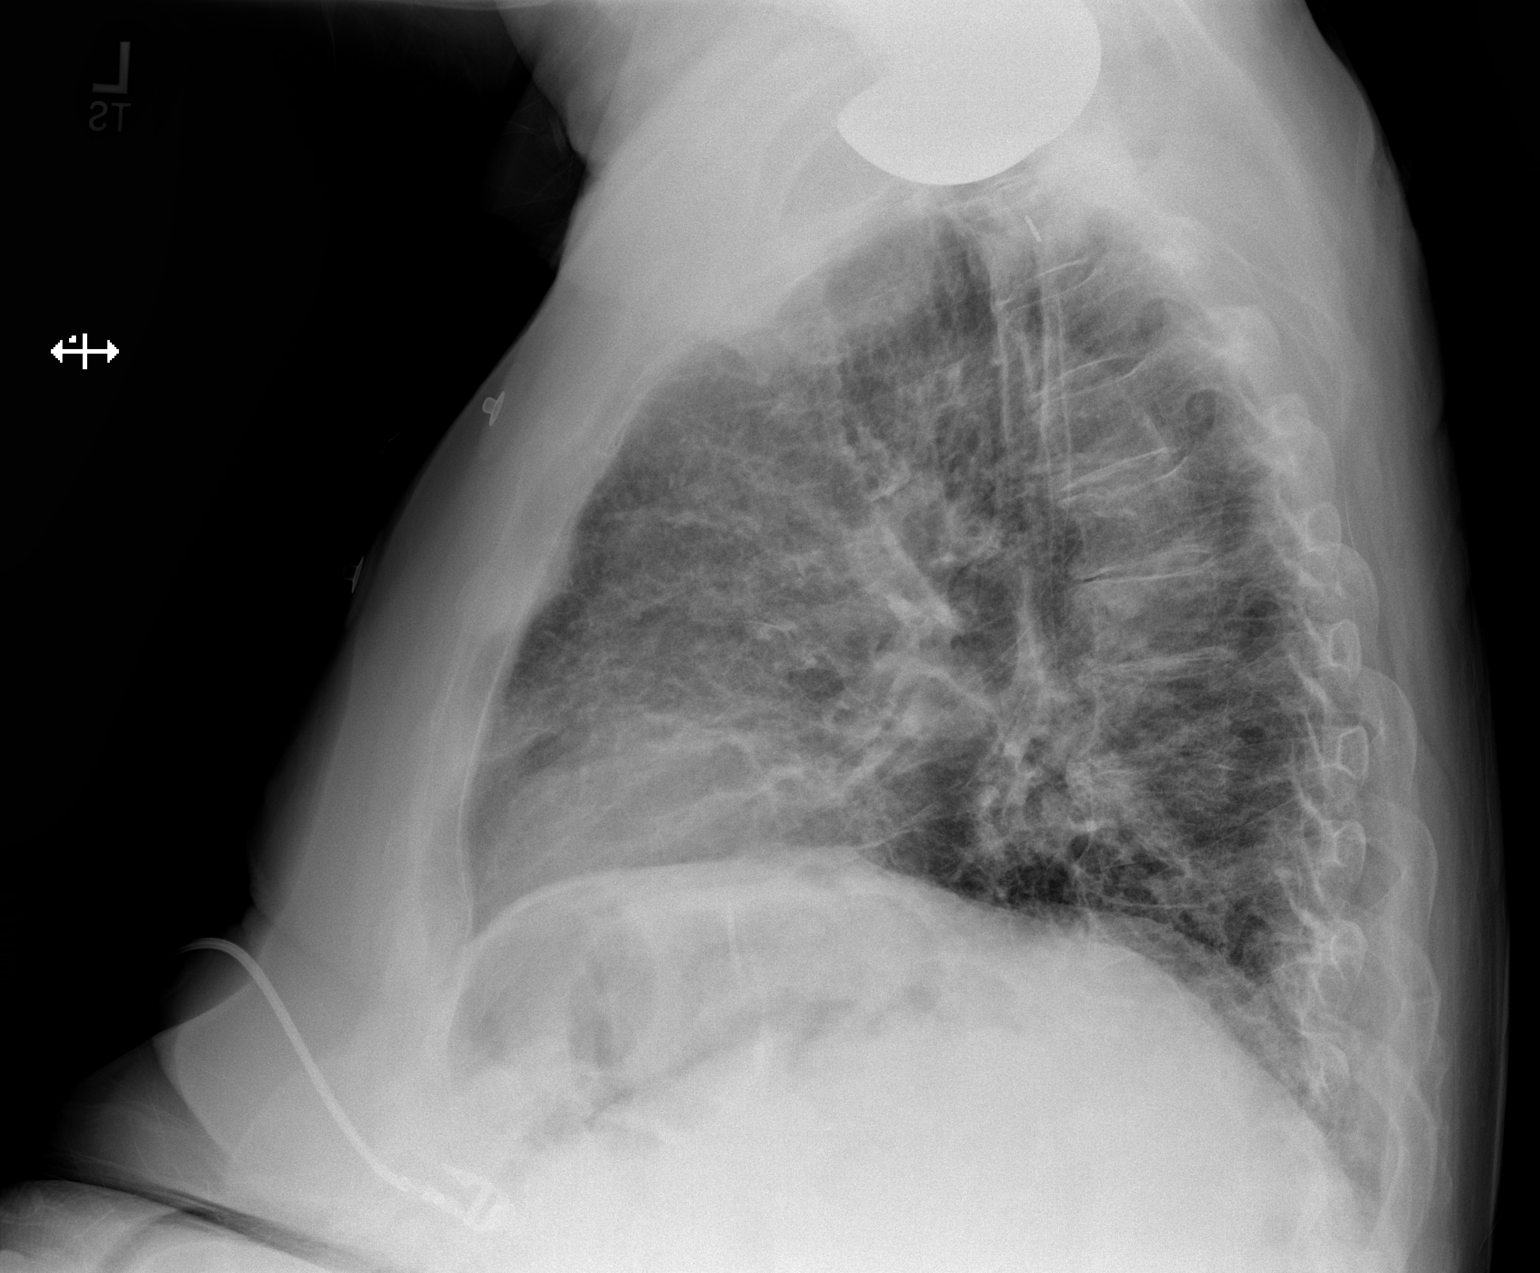

[2 of 2 positions shown; findings below may reference images not displayed]

PROCEDURE:     DXR - DXR CHEST PA (OR AP) AND LATERAL  - April 30, 2012 [DATE]

RESULT:     Comparison is made to the study 07 January, 2010. Bilateral
shoulder hemiarthroplasty changes are present. The interstitial markings are
diffusely prominent. There is escort atelectasis of the right lung base.
There is patchy increased density in the mid and lower left lung.
Interstitial markings are prominent. There is no significant effusion or
pneumothorax. The cardiac silhouette is normal. Atherosclerotic
calcification is present.
IMPRESSION: 1. Patchy areas of bilateral density which could represent infiltrate,
atelectasis and/or fibrosis. No effusion. No pneumothorax.

[REDACTED]

## 2014-08-13 NOTE — H&P (Signed)
PATIENT NAME:  Aaron Cook, FEBUS MR#:  [id number] DATE OF BIRTH:  1920/08/13  DATE OF ADMISSION:  05/08/2012  PRIMARY CARE PHYSICIAN:  Jerrell Belfast, MD.    CHIEF COMPLAINT: Sent in for hemoglobin of 6.6.   HISTORY OF PRESENT ILLNESS: This is an 81 year old man who has not been feeling well lately. He has been sleepy and tired, feeling lousy, weak and shaky. He has an appointment with Dr. Grayland Ormond on Monday, hematology. He was sent in for a low blood count. No chest pain, but positive for shortness of breath. The patient was guaiac-negative in the ER, and hemoglobin in the ER was 7.4. Hospitalist services were contacted for further evaluation.   PAST MEDICAL HISTORY: History of congestive heart failure, coronary artery disease, hypothyroidism, gastroesophageal reflux disease, osteoarthritis, chronic pain, hyperlipidemia, depression, history of prostate cancer.   PAST SURGICAL HISTORY: Bilateral shoulder replacement, left knee replacement, prostate seed implantation, cardiac ablation, cataracts.   ALLERGIES: ATROPINE AND SCOPOLAMINE.   MEDICATIONS: Include artificial tears one drop left eye 3 times a day, aspirin 81 mg daily, Celebrex 200 mg twice a day, doxepin 25 mg at bedtime, Flomax 0.4 mg daily, Flonase 2 sprays each nostril daily, guaifenesin p.r.n., Levaquin daily for 7 days, Lipitor 10 mg daily, Norco 5/325 every 4 hours as needed for pain, Paxil 20 mg daily, Prilosec 20 mg daily, Synthroid 137 mcg daily, Systane eye drops twice a day, Toprol-XL 12.5 mg daily, Tylenol p.r.n., vitamin B12 at 500 mcg 2 tablets daily, Xalatan one drop right eye at bedtime, Zyrtec 10 mg daily.   SOCIAL HISTORY: Lives at [facility]. Quit smoking 1967. Occasional alcohol. He used to work as a Automotive engineer at [employer], Tour manager.   FAMILY HISTORY: Father died at 34 of old age. Mother died at 42 of old age.   REVIEW OF SYSTEMS: CONSTITUTIONAL: Positive for fatigue. No fever, chills, or  sweats. No weight loss. No weight gain.  EYES: Does wear glasses.  EARS, NOSE, MOUTH AND THROAT: Decreased hearing. No sore throat. No difficulty swallowing.  CARDIOVASCULAR: No chest pain. No palpitations.  RESPIRATORY: Positive for shortness of breath. Positive for coughing. No sputum. No hemoptysis.  GASTROINTESTINAL: No nausea. No vomiting. No abdominal pain. No diarrhea. No constipation. No bright red blood per rectum. No melena.  GENITOURINARY: No burning on urination or hematuria.  MUSCULOSKELETAL: Positive for arthritis pain.  INTEGUMENT: Did have some lumps on the skin that disappeared.  NEUROLOGIC: No fainting or blackouts.  PSYCHIATRIC: On medication for depression.  ENDOCRINE: Positive for hypothyroidism.  HEMATOLOGIC AND LYMPHATIC: History of anemia.   PHYSICAL EXAMINATION: VITAL SIGNS: Temperature 98, pulse 88, respirations 16, blood pressure 148/65, pulse oximetry 97% on room air.  GENERAL: No respiratory distress.  EYES: Conjunctivae pale. Lids normal. Pupils equal, round and reactive to light. Extraocular muscles intact. No nystagmus.  EARS, NOSE, MOUTH AND THROAT: Tympanic membranes no erythema. Nasal mucosa: No erythema. Throat: No erythema. No exudate seen. Lips and gums: No lesions.  NECK: No JVD. No bruits. No lymphadenopathy. No thyromegaly. No thyroid nodules palpated.  RESPIRATORY: Lungs clear to auscultation. No use of accessory muscles to breathe. No rhonchi, rales or wheeze heard.  CARDIOVASCULAR: S1, S2 normal. Positive 2/6 systolic ejection murmur. Carotid upstroke 2+ bilaterally. No bruits.  EXTREMITIES: Dorsalis pedis pulses 2+ bilaterally, 2+ edema bilateral lower extremity.  ABDOMEN: Soft, nontender. No organomegaly/splenomegaly. Normoactive bowel sounds. No masses felt.  LYMPHATIC: No lymph nodes in the neck.  MUSCULOSKELETAL: No clubbing, edema or cyanosis.  SKIN: No skin masses felt, but he does have some small areas on the arms of some erythema.    NEUROLOGIC: Cranial nerves II through XII are grossly intact. Deep tendon reflexes are 1+ bilateral lower extremity.  PSYCHIATRIC: The patient is oriented to person, place and time.  RECTAL: Done by ER physician is guaiac-negative.   LABORATORY AND RADIOLOGICAL DATA: Urinalysis negative. PT slightly elevated at 40.9. Glucose 109, BUN 27, creatinine 1.33, sodium 137, potassium 4.5, chloride 104, CO2 of 26, calcium 8.5, GFR 47. White blood cell count 4.2, hemoglobin and hematocrit 7.4 and 21.6, platelet count of 179, MCV 100. PT 15.2, INR 1.2. Looking back at prior labs, the patient's hemoglobin recently on January 8th was 8.1. Recent creatinine 1.38. Iron studies were actually in the normal range.   ASSESSMENT AND PLAN:  1.  Acute symptomatic anemia, which is guaiac negative. We will send off iron studies again. I will transfuse 1 unit of packed red blood cells over 4 hours. Since the patient's PT and INR are slightly up, we will give a dose of vitamin K. We will get an oncology consultation. Can consider iron and Procrit as outpatient. We will hold aspirin and Celebrex at this time.  2.  History of congestive heart failure. No signs of congestive heart failure. Give Lasix prior to transfusion.  3.  History of coronary artery disease. Hold aspirin with anemia. Continue Toprol.  4.  Hyperlipidemia, on Lipitor.  5.  Gastroesophageal reflux disease. Increase Prilosec to 20 mg b.i.d.  6.  Hypothyroidism. Continue same dose of Synthroid.  7.  History of prostate cancer and benign prostatic hypertrophy, on Flomax.  8.  Depression, on Paxil.  9.  Finish course of antibiotic for bronchitis as outpatient. Stop Levaquin at this time.  10.  Chronic pain, on Norco.  11.  Chronic kidney disease. Creatinine seems stable from the last time here in the hospital. Continue current medications.   TIME SPENT ON ADMISSION: 55 MINUTES.   CODE STATUS: The patient is a full code.     ____________________________ Tana Conch. Leslye Peer, MD rjw:cc D: 05/08/2012 18:19:00 ET T: 05/08/2012 18:33:45 ET JOB#: [id number]  cc: Jerrell Belfast, MD Tana Conch. Leslye Peer, MD, <Dictator>   Marisue Brooklyn MD ELECTRONICALLY SIGNED 05/09/2012 18:46

## 2014-08-13 NOTE — H&P (Signed)
PATIENT NAME:  Aaron Cook, Aaron Cook MR#:  [id number] DATE OF BIRTH:  1920-06-12  DATE OF ADMISSION:  05/01/2012  PRIMARY CARE PHYSICIAN:  Dr. Brunetta Genera.   CODE STATUS:  Full code.   CHIEF COMPLAINT:  Feeling bad.   HISTORY OF PRESENT ILLNESS:  This is an 81 year old gentleman, very hard of hearing, who resides at [facility].  He states that today he just started feeling terrible.  He was not able to precisely quantify exactly what this meant.  He states he was a little bit weak, he has a little bit of cold, he just felt bad and tired.  He denies any fevers, any shortness of breath, any cough, any muscle aches, any nausea, any vomiting, any diarrhea, any altered mentation.  He asked the nurse to examine him and he states she heard wheezing and he was sent to the ER.  The patient states he did have his flu shot this season.  He has also had his pneumonia shot in the past.  History provided by the patient who is alert and oriented.    REVIEW OF SYSTEMS:  All 10 point systems reviewed and negative except as noted in the HPI.   PAST MEDICAL HISTORY:  Includes coronary artery disease with history of congestive heart failure, hypothyroidism, GERD, osteoarthritis, chronic pain, dyslipidemia, urinary incontinence, very hard of hearing, depression, history of prostate cancer with radiation seeding.   PAST SURGICAL HISTORY:  Includes left knee replacement, hernia repair, bilateral cataract surgery, cardiac stents, bilateral shoulder replacement.    MEDICATIONS:  Aspirin 81 mg daily, Celebrex 200 mg daily, Flonase daily, guaifenesin p.r.n, Lipitor 10 mg daily, Norco 5/325 p.r.n. q. 4 hours, Paxil 200 mg daily, Prilosec over the counter, Synthroid 125 mcg daily, Syntane preservative-free ophthalmic solution to affected eye twice daily, Toprol XL 25 mg half tablet daily, vitamin B12, Xalatan eye drops to affected eye once a day, Zyrtec 10 mg daily.   ALLERGIES:  ATROPINE AND  SCOPOLAMINE.   SOCIAL HISTORY:  Negative for tobacco, alcohol or illicit drugs.  The patient does have remote history of smoking.  As stated, he stays at [facility]  He walks with a walker.   FAMILY HISTORY:  Significant for coronary artery disease; a brother died at age 8.   PHYSICAL EXAMINATION: VITAL SIGNS:  Blood pressure 138/67, pulse 95, respirations 16, temperature 98.9, saturating greater than 96% on room air.  GENERAL:  Alert, oriented male in no acute distress.  EYES:  Pink conjunctivae.  PERRLA.  EARS, NOSE, THROAT:  Moist oral mucosa.  Trachea midline.  NECK:  Supple.  LUNGS:  Clear.  No wheeze.  No use of accessory muscles.  CARDIOVASCULAR:  Regular rate and rhythm without murmurs, regurgitation or gallops. No JVD.  ABDOMEN:  Soft, positive bowel sounds.  Nontender, nondistended.  No organomegaly.  NEUROLOGIC:  Cranial nerves II through XII grossly intact.  Sensation intact.  MUSCULOSKELETAL:  Strength is 5/5 in all extremities.  No clubbing, cyanosis or edema.  SKIN:  The patient does have some erythematous nodules on the left upper extremity in the arm, which he states is new as of last week.  He also has these nodules on the right leg, specifically in the thigh.  PSYCHIATRIC:  Appropriate male.  LABORATORY, RADIOLOGICAL AND DIAGNOSTIC DATA:  Glucose 114, BUN 28, creatinine 1.3, sodium 134, potassium 4.6, chloride 103, CO2 of 24.  LFTs are normal with the exception of alkaline phosphatase shows 151.  Troponin less than 0.02.  White blood count 4.8, hemoglobin 8.1, platelets 196.  Patient's baseline creatinine is 1.08.  Patient's baseline hemoglobin is 10.1.  A chest x-ray shows possible right middle lobe pneumonia.   ASSESSMENT AND PLAN: 1.  Hospital-acquired pneumonia.  The patient will be admitted with Levaquin and vancomycin, oxygen, DuoNebs p.r.n.  Sputum cultures and blood cultures have been ordered.  2.  Anemia.  No reports of any acute bleed.  I will go ahead and  I will guaiac stool and I will order an anemia panel and repeat CBC in the a.m.  A blood transfusion has not currently been ordered.  3.  Coronary artery disease.  4.  Hypothyroidism.  5.  Gastroesophageal reflux disease. 6.  Osteoarthritis.  7.  Dyslipidemia.  8.  Depression, all stable, home medications.  Resume ADA diet and sliding scale insulin.  9.  History of prostate cancer.  10.  New erythematous nodules in the arm and I am going to go ahead and order some tumor markers including PSA, CEA, alpha fetoprotein.  No further work up will be pursued.  The patient is advised that if needed he should follow up with his outpatient PCP these 2 nodules in the arm.  11.  Mild acute on chronic kidney disease.  Some gentle IV fluid hydration will also be ordered.     ____________________________ Quintella Baton, MD dc:ea D: 05/01/2012 02:56:29 ET T: 05/01/2012 06:39:37 ET JOB#: [id number]  cc: Quintella Baton, MD, <Dictator> Quintella Baton MD ELECTRONICALLY SIGNED 06/13/2012 14:04

## 2014-08-13 NOTE — Discharge Summary (Signed)
PATIENT NAME:  Aaron Cook, Aaron Cook MR#:  [id number] DATE OF BIRTH:  09/07/1920  DATE OF ADMISSION:  05/08/2012 DATE OF DISCHARGE:  05/09/2012  ADMITTING DIAGNOSIS: Symptomatic anemia.  DISCHARGE DIAGNOSES:  1.  Symptomatic anemia of chronic disease as well as possibly element of iron deficiency anemia, status post 2 units of packed red blood cell transfusion on the 16th as well as 05/09/2012.  2.  Leukopenia. 3.  Mild coagulopathy of unclear etiology, possibly vitamin K deficiency.  4.  History of chronic kidney disease stage III, gastroesophageal reflux disease, hyperlipidemia, coronary artery disease with history of congestive heart failure, chronic, stable.  5.  Known cardiomyopathy with ejection fraction of 45% to 50%.  6.  History of hypothyroidism, osteoarthritis, chronic pain syndrome, depression, hearing loss as well as prostate carcinoma.  DISCHARGE CONDITION: Stable.   DISCHARGE MEDICATIONS:  1.  The patient is to resume Flomax 0.4 mg p.o. once a day. 2.  Flonase 2 sprays once daily for sinus congestion. 3.  Lipitor 10 p.o. daily.  4.  Norco 5/325 mg 1 tablet every 4 hours as needed.  5.  Synthroid 137 mcg p.o. daily. 6.  Systane Ultra preservative-free ophthalmic solution 1 drop to left eye twice a day for glaucoma.  7.  Toprol-XL 12.5 mg p.o. daily.  8.  Xalatan 0.005% ophthalmic solution 1 drop to right eye once at bedtime.  9.  Doxepin 25 mg 1 capsule once at bedtime.  10.  Paxil 20 mg p.o. daily.  11.  Levaquin 500 mg p.o. daily. The patient is to use this medications for bronchitis. This is to finish his therapy for bronchitis.  12.  Artificial Tears ophthalmic solution 1 drop to left eye 3 times daily for glaucoma. 13.  Guaifenesin 100 mg in 5 mL oral liquid, 20 mL every 4 hours as needed.  14.  Prilosec 20 mg p.o. daily.  15.  Tylenol 325 mg p.o. 2 tablets every 4 hours as needed.  16.  Vitamin B12, 500 mcg p.o. 2 tablets once a day.  17.  Zyrtec 10 mg p.o. daily.    The patient is not to take Celebrex or aspirin unless recommended by primary care physician.   HOME OXYGEN: None.   DIET: Two gram salt, low fat, low cholesterol, regular consistency.   ACTIVITY LIMITATIONS: As tolerated.   DISCHARGE FOLLOWUP: Followup appointment with Dr. Venia Minks in 2 days after discharge as well as Dr. Grayland Ormond as previously scheduled.   CONSULTANTS: Care management.   RADIOLOGICAL STUDIES: None.   HISTORY OF PRESENT ILLNESS AND HOSPITAL COURSE: The patient is an 81 year old Caucasian male with past medical history significant for history of chronic anemia who presented to the hospital with complaints of not feeling well, having a low hemoglobin level of 6.6 as well as being sleepy, tired, feeling lousy, weak and shaky. Please refer to Dr. Marshia Ly admission note on 05/08/2012. On arrival to the hospital, the patient's pulse was 88, respiration rate 16, blood pressure 148/65, pulse oximetry 97% on room air, temperature 98. Physical exam was unremarkable. The patient's lab data in the Emergency Room on January 16 showed glucose of 109. BUN and creatinine were 27 and 1.33. Iron level was 22. Estimated GFR for non-African American would be 47. The patient's iron-binding capacity was low at 207. The patient's iron saturation was somewhat low also at 11. The patient's ferritin level was 449, high,  and LDH was 146, which was within normal limits. CBC: White blood cell count 4.2, hemoglobin 7.4,  platelet count 179. Absolute neutrophil count of 2.2, no left shift. The patient's pro time was elevated to 15.2; INR was 1.2 and activated PTT was 40.9. Urinalysis was unremarkable. Vitamin B12 level was high at 1184.   The patient was admitted to the hospital. He was transfused with 1 unit of packed red blood cells on 05/08/2012. After transfusion, his hemoglobin level improved to 8.5. He felt somewhat better, however, still complained of some weakness and some shortness of breath. Because  of his history of coronary artery disease and history of congestive heart failure, it was felt that the patient would benefit from 1 more unit of transfusion, and he was type and screened and transfused 1 more unit of packed red blood cells. Unfortunately, we were not able to check his posttransfusion hemoglobin level, but it is recommended to check after he returns back home. His vital signs remained stable. On the day of discharge, the patient's temperature was 98.4, pulse 85, respiratory rate 18, blood pressure 105/62, saturation 94% on room air at rest. It was felt that the patient is stable to return back to assisted living facility with home health. No iron was supplemented after this transfusion due to patient receiving 2 units of packed red blood cells.  In regards to chronic renal insufficiency, the patient's creatinine was 1.33 on day of admission, 05/08/2012. However, the patient's creatinine somewhat improved to 1.13 by 05/09/2012 after transfusion. His sodium level, however, was somewhat low at 134, which was felt could be related to transfusion itself or alternatively to Lasix he received pretransfusion. The patient's white blood cell count was noted to be somewhat low on 05/09/2012 at 3.6. However, the patient's absolute neutrophil count was normal at 2.0. Hemoglobin level, as mentioned above, before the first transfusion was 8.5 and after the second transfusion was not checked. Platelet count remained stable. It is recommended to follow the patient's CBC and refer him back for transfusions if needed.   The patient was noted to have mild coagulopathy. His prothrombin time level was found to be slightly elevated at 15.2 on day of admission with INR level of 1.2, and activated PTT was 40.9. It was unclear why he had some mild coagulopathy. The patient did receive vitamin K orally while in the hospital. It is recommended to follow his coagulation panel in the next week after discharge. The patient's  vital signs on the day of discharge are stable. He is to follow up with his primary care physician as well as Dr. Grayland Ormond in the next 1 week after discharge.   TIME SPENT: 40 minutes.   ____________________________ Theodoro Grist, MD rv:jm D: 05/09/2012 18:41:18 ET T: 05/10/2012 21:33:40 ET JOB#: [id number]  cc: Theodoro Grist, MD, <Dictator> Jerrell Belfast, MD [place] MD ELECTRONICALLY SIGNED 06/12/2012 15:50

## 2014-08-13 NOTE — Consult Note (Signed)
History of Present Illness:   Reason for Consult Symptomatic anemia    HPI   Patient last seen in the [place] in September 2013.  Patient states he was in his usual state of health but then over the last several weeks became progressively more weak and fatigued. Upon further evaluation he was found to have a hemoglobin of 6.0.  He feels improved since receiving a blood transfusion overnight. He denies any recent fevers or illnesses. He has no easy bleeding or bruising.  He denies any weight loss.  He has no neurologic complaints.  He denies any chest pain or shortness of breath.  He denies any nausea, vomiting, constipation, or diarrhea.  He has no urinary complaints.  Patient offers no further specific complaints today.  PFSH:   Additional Past Medical and Surgical History Past medical history: CAD, hyperlipidemia, hypertension, hypothyroidism, atrial fibrillation, CHF, GERD, arthritis, depression, prostate cancer status post seed.  Past surgical history: Left knee replacement, hernia repair, bilateral cataracts, cardiac stent, bilateral shoulder replacement.  Family history: Negative and noncontributory.  Social history: Patient denies tobacco or alcohol.  Currently resides in a nursing home.   Review of Systems:   Performance Status (ECOG) 1    Review of Systems   As per HPI. Otherwise, 10 point system review was negative.   NURSING NOTES: **Vital Signs.:   17-Jan-14 17:59    Vital Signs Type: Q 4hr    Temperature Temperature (F): 98.4    Celsius: 36.8    Pulse Pulse: 85    Respirations Respirations: 20    Systolic BP Systolic BP: 510    Diastolic BP (mmHg) Diastolic BP (mmHg): 62    Mean BP: 76    Pulse Ox % Pulse Ox %: 94    Pulse Ox Activity Level: At rest    Oxygen Delivery: Room Air/ 21 %   Physical Exam:   Physical Exam General: Well-developed, well-nourished, no acute distress. Eyes: Pink conjunctiva, anicteric sclera. HEENT: Normocephalic, moist  mucous membranes, clear oropharnyx. Lungs: Clear to auscultation bilaterally. Heart: Regular rate and rhythm. No rubs, murmurs, or gallops. Abdomen: Soft, nontender, nondistended. No organomegaly noted, normoactive bowel sounds. Musculoskeletal: No edema, cyanosis, or clubbing. Neuro: Alert, answering all questions appropriately. Cranial nerves grossly intact. Skin: No rashes or petechiae noted. Psych: Normal affect. Lymphatics: No cervical, calvicular, axillary or inguinal LAD.    Atropine: Unknown  Scopolamine: Unknown    Levaquin 500 mg oral tablet: 1 tab(s) orally once a day for 7 days for bronchitis, Active, 0, None   Flomax 0.4 mg oral capsule: 1 cap(s) orally once a day for urinary retention, Active, 0, None   Flonase 50 mcg/inh nasal spray: 2 spray(s) nasal once a day for sinus congestion, Active, 0, None   Lipitor 10 mg oral tablet: 1 tab(s) orally once a day for hyperlipidemia, Active, 0, None   Norco 5 mg-325 mg oral tablet: 1 tab(s) orally every 4 hours, As Needed- for Pain , Active, 0, None   Synthroid 137 mcg (0.137 mg) oral tablet: 1 tab(s) orally once a day (in the morning) for hypothyroidism, Active, 0, None   Systane Ultra Preservative Free ophthalmic solution: 1 drop(s) to left eye 2 times a day for glaucoma, Active, 0, None   Toprol-XL: 12.5 milligram(s) orally once a day for hypertension, Active, 0, None   Xalatan 0.005% ophthalmic solution: 1 drop(s) to right eye once a day (at bedtime) for glaucoma, Active, 0, None   doxepin 25 mg oral capsule:  1 cap(s) orally once a day (at bedtime) for depression, Active, 0, None   Paxil 20 mg oral tablet: 1 tab(s) orally once a day (in the morning) for depression, Active, 0, None   Artificial Tears ophthalmic solution: 1 drop(s) to left eye 3 times a day for glaucoma, Active, 0, None   guaifenesin 100 mg/5 mL oral liquid: 20 milliliter(s) orally every 4 hours, As Needed for cough, Active, 0, None   Prilosec 20 mg oral  delayed release capsule: 1 cap(s) orally once a day for indigestion, Active, 0, None   Tylenol 325 mg oral tablet: 2 tab(s) orally every 4 hours, As Needed- for Pain/fever, Active, 0, None   Vitamin B-12 500 mcg oral tablet: 2 tab(s) orally once a day for nutritional supplement, Active, 0, None   Zyrtec 10 mg oral tablet: 1 tab(s) orally once a day for itchy or dry eyes/allergies, Active, 0, None  Laboratory Results: Routine Chem:  17-Jan-14 06:43    Glucose, Serum  106   BUN  26   Creatinine (comp) 1.13   Sodium, Serum  134   Potassium, Serum 4.1   Chloride, Serum 102   CO2, Serum 24   Calcium (Total), Serum 8.6   Anion Gap 8   Osmolality (calc) 273   eGFR (African American) >60   eGFR (Non-African American)  57 (eGFR values <77m/min/1.73 m2 may be an indication of chronic kidney disease (CKD). Calculated eGFR is useful in patients with stable renal function. The eGFR calculation will not be reliable in acutely ill patients when serum creatinine is changing rapidly. It is not useful in  patients on dialysis. The eGFR calculation may not be applicable to patients at the low and high extremes of body sizes, pregnant women, and vegetarians.)  Routine Coag:  17-Jan-14 06:43    Prothrombin  15.1   INR 1.2 (INR reference interval applies to patients on anticoagulant therapy. A single INR therapeutic range for coumarins is not optimal for all indications; however, the suggested range for most indications is 2.0 - 3.0. Exceptions to the INR Reference Range may include: Prosthetic heart valves, acute myocardial infarction, prevention of myocardial infarction, and combinations of aspirin and anticoagulant. The need for a higher or lower target INR must be assessed individually. Reference: The Pharmacology and Management of the Vitamin K  antagonists: the seventh ACCP Conference on Antithrombotic and Thrombolytic Therapy. CVFIEP.3295Sept:126 (3suppl): [id number] A HCT value >55%  may artifactually increase the PT.  In one study,  the increase was an average of 25%. Reference:  "Effect on Routine and Special Coagulation Testing Values of Citrate Anticoagulant Adjustment in Patients with High HCT Values." American Journal of Clinical Pathology 2006;126:400-405.)   Activated PTT (APTT)  42.3 (A HCT value >55% may artifactually increase the APTT. In one study, the increase was an average of 19%. Reference: "Effect on Routine and Special Coagulation Testing Values of Citrate Anticoagulant Adjustment in Patients with High HCT Values." American Journal of Clinical Pathology 2006;126:400-405.)  Routine Hem:  17-Jan-14 06:43    WBC (CBC)  3.6   RBC (CBC)  2.47   Hemoglobin (CBC)  8.5   Hematocrit (CBC)  24.2   Platelet Count (CBC) 180   MCV 98   MCH  34.3   MCHC 34.9   RDW 14.3   Neutrophil % 54.5   Lymphocyte % 33.6   Monocyte % 4.7   Eosinophil % 6.6   Basophil % 0.6   Neutrophil # 2.0   Lymphocyte #  1.2   Monocyte # 0.2   Eosinophil # 0.2   Basophil # 0.0 (Result(s) reported on 09 May 2012 at 07:18AM.)   Assessment and Plan:  Impression:   Anemia.  Plan:   1.  Anemia: Patient's white blood cell count Is only slightly decreased and his platelets are within normal limits.  Hemoglobin significantly improved to 8.5 with blood transfusion.  Patient was noted to be iron deficient and will order 510 mg IV Feraheme today.  Patient initially had an appointment in the [facility] on Monday, but will cancel this nd reschedule for 1-2 weeks after discharge.  He expressed understanding and was in agreement with this plan.  consult, will follow.  Electronic Signatures: Delight Hoh (MD)  (Signed 17-Jan-14 18:46)  Authored: HISTORY OF PRESENT ILLNESS, PFSH, ROS, NURSING NOTES, PE, ALLERGIES, HOME MEDICATIONS, LABS, ASSESSMENT AND PLAN   Last Updated: 17-Jan-14 18:46 by Delight Hoh (MD)

## 2014-08-13 NOTE — Discharge Summary (Signed)
PATIENT NAME:  Aaron Cook, Aaron Cook MR#:  [id number] DATE OF BIRTH:  December 06, 1920  DATE OF ADMISSION:  05/01/2012 DATE OF DISCHARGE:    PRIMARY CARE PHYSICIAN: Meindert A. Brunetta Genera, MD  HEMATOLOGIST: Kathlene November. Grayland Ormond, MD  CHIEF COMPLAINT: Feeling bad.   DISCHARGE DIAGNOSES:  1.  Chronic anemia. 2.  Chronic kidney disease, stage III.  3.  Skin nodules of unknown etiology.  4.  Coronary artery disease with a history of congestive heart failure.  5.  Hypothyroidism.  6.  Gastroesophageal reflux disease. 7.  Osteoarthritis. 8.  Chronic pain.  9.  Dyslipidemia.  10.  Urinary incontinence.  11.  Hard of hearing.  12.  Depression.  13.  History of prostate cancer with radiation seeds.   DISCHARGE MEDICATIONS: Paxil 20 mg daily, Lipitor 10 mg daily, aspirin 81 mg daily, Prilosec OTC 20 mg daily, Zyrtec 10 mg daily, doxepin 25 mg once a day at bedtime, Celebrex 200 mg 2 times a day, Artificial Tears ophthalmic solution 1 drop to each affected eye 3 times a day, Synthroid 137 mcg daily, Flonase 50 mcg 2 sprays once a day, Toprol-XL 25 mg 1/2 tab daily, vitamin B12 at 500 mcg 2 tabs once a day, Systane preservative-free ophthalmic solution 1 drop to each affected eye 2 times a day, Tylenol 650 mg every 4 hours as needed for pain, guaifenesin 100 mg/5 mL oral liquid 20 mL every 4 hours as needed for cough, Norco 5/325 one tab every 4 hours as needed for pain, Xalatan 0.005% ophthalmic solution 1 drop to right eye once a day at bedtime.   DIET: Low sodium.   ACTIVITY: As tolerated.   FOLLOWUP: Follow with your primary care physician within 1 to 2 weeks. Follow with your hematologist, Dr. Grayland Ormond, within a week for CBC check. Follow with dermatologist within 1 to 2 weeks for your skin nodules.   DISPOSITION: Back to rehab.   CODE STATUS: Full code.   HISTORY OF PRESENT ILLNESS AND HOSPITAL COURSE: For full details of H and P, please see the dictation on January 9 by Dr. Claria Dice, but briefly  this is an 81 year old male with multiple comorbidities coming from [place] who was not feeling well. He was seen by the nurse and it was thought that he was wheezing and admitted here for healthcare-associated pneumonia, started on vanc and Zosyn broad-spectrum. Initial BUN was 28. Iron was 25. Sodium was 134. Creatinine was 1.38. His troponin was negative. His hemoglobin of note was 8.1 and his last hemoglobin was 10.1 from September 12. He does follow with Dr. Grayland Ormond as an outpatient. His retic count was 1.8 of note. Initial x-ray of the chest showed patchy areas of bilateral density, which could represent infiltrate, atelectasis and/or fibrosis. The patient had no fever nor leukocytosis. There was no focal pneumonia. He was not hypoxic. For further clarification of those findings of x-ray of the chest, a CT of the chest was performed, which showed improvement over the previous CT scan without any focal infiltrate. There is marked decreased distribution of pulmonary densities which were found on the previous CT scan. There is a component of moderate interstitial lung disease but no focal regions of consolidation. At this point, the vanc and Zosyn will be stopped and he will be discharged home without antibiotics. I doubt he is having a pneumonia at this point. He does have a drop in his hemoglobin but has chronic anemia and follows with Dr. Grayland Ormond. He was advised to follow with him  as an outpatient for further care. He also did have subacute nodule densities on his extremities. He has been having these for several weeks. A dermatology consult was attempted; however, we have no inpatient coverage at this point and he was recommended to follow with dermatology as an outpatient.   TOTAL TIME SPENT: 33 minutes.    ____________________________ Vivien Presto, MD sa:jm D: 05/02/2012 14:27:45 ET T: 05/02/2012 15:18:45 ET JOB#: [id number]  cc: Vivien Presto, MD, <Dictator> Kathlene November. Grayland Ormond,  MD Meindert A. Brunetta Genera, MD Vivien Presto MD ELECTRONICALLY SIGNED 05/10/2012 17:32

## 2014-08-14 NOTE — Consult Note (Signed)
PATIENT NAME:  Aaron Cook, Aaron Cook MR#:  [id number] DATE OF BIRTH:  1920-08-27  DATE OF CONSULTATION:  10/03/2013  CONSULTING PHYSICIAN:  Elta Guadeloupe A. Marina Gravel, MD  REASON FOR CONSULTATION: Left leg hematoma.   HISTORY OF PRESENT ILLNESS: A 81 year old white male admitted to the hospital recently to the medical service, who was found yesterday to have a large hematoma of his right calf. Ultrasound demonstrates this to be a complex hematoma. He denies any trauma. He is being treated for bilateral pneumonia. Right hilar mass measuring 4 cm was found on recent CT scan. Pulmonary consult is pending. The patient has multiple medical problems.   ALLERGIES: ATROPINE AND SCOPOLAMINE.    PAST MEDICAL HISTORY: As described in the history of present illness.   PERTINENT PHYSICAL EXAMINATION:  GENERAL: The patient is very hard of hearing. He is lying in bed in no obvious distress.  EXTREMITIES: There is a left knee scar. There is a 5 cm x 5 cm soft tissue mass in the posterior medial aspect of the left calf. It is consistent with a hematoma. The overlying skin is intact. The hematoma is soft. There is no evidence of cellulitis. Ice pack is being applied.   IMPRESSION: Left leg hematoma, likely secondary to ruptured varix.   RECOMMENDATIONS: At present, I see no indication for surgical I and D. The patient's leg needs to be elevated and closely watched. We will follow with you.   ____________________________ Jeannette How. Marina Gravel, MD mab:lb D: 10/03/2013 08:35:22 ET T: 10/03/2013 09:06:07 ET JOB#: [id number]  cc: Elta Guadeloupe A. Marina Gravel, MD, <Dictator> Hortencia Conradi MD ELECTRONICALLY SIGNED 10/04/2013 12:18

## 2014-08-14 NOTE — Consult Note (Signed)
Brief Consult Note: Diagnosis: left calf hematoma, likely ruptured varix.   Patient was seen by consultant.   Consult note dictated.   Comments: no need for I and D, skin ok, soft, elevate and ice and compression.  will follow.  Electronic Signatures: Sherri Rad (MD)  (Signed 13-Jun-15 08:36)  Authored: Brief Consult Note   Last Updated: 13-Jun-15 08:36 by Sherri Rad (MD)

## 2014-08-14 NOTE — Discharge Summary (Signed)
PATIENT NAME:  Aaron Cook, Aaron Cook MR#:  [id number] DATE OF BIRTH:  May 28, 1920  DATE OF ADMISSION:  10/01/2013 DATE OF DISCHARGE:  10/07/2013   ADMITTING DIAGNOSES: Bilateral pneumonia, right hilar mass.  DISCHARGE DIAGNOSES: 1. Acute respiratory failure.  2. Bilateral pneumonia.  3. Chronic obstructive pulmonary disease exacerbation due to pneumonia.  4. Suspected mild acute on chronic diastolic congestive heart failure.  5. Acute posthemorrhagic anemia.  6. Left lower extremity calf hematoma. 7. New right hilar mass of unclear etiology, suspected malignancy.  8. Hyponatremia, suspected syndrome of inappropriate antidiuretic hormone, fluid restriction is recommended.  9. History of coronary artery disease.  10. Atrial fibrillation.  11. Hypertension.  12. Diastolic congestive heart failure.  13. Anemia of chronic disease.  14. Hyperlipidemia.  15. Hypothyroidism.  16. Depression.  17. Prostate carcinoma. 18. Glaucoma.   DISCHARGE CONDITION: Stable.   DISCHARGE MEDICATIONS: The patient is to continue:  1. Flomax 0.4 mg once daily.  2. Flonase 2 sprays once daily.  3. Synthroid 137 mcg p.o. daily.  4. Xalatan 0.005% ophthalmic solution to the right eye at bedtime.  5. Paxil 20 mg p.o. daily.  6. Prilosec 10 mg p.o. daily.  7. Zyrtec 10 mg p.o. daily.  8. Fosamax 70 mg p.o. on Saturdays once weekly.  9. Guaifenesin 100 mg/5 mL oral liquid 20 mL every 4 hours as needed.  10. Calcium carbonate 500 mg p.o. daily.  11. Folic acid 1 mg 3 tablets once daily.  12. Lasix 10 mg p.o. daily.  13. Potassium chloride 10 mEq once daily.  14. Prednisone 60 mg p.o. once on 10/08/2013, then taper by 20 mg daily until stopped. The patient is to resume his usual dose of prednisone which is 15 mg p.o. daily dose.  15. Proscar 5 mg p.o. daily.  16. Singulair 10 mg p.o. daily.  17. Tylenol 325 mg 2 tablets once daily at bedtime.  18. Vitamin B12 500 mcg tablets 2 tablets once daily.  19.  Vitamin D3 2000 units once daily.  20. Brimonidine ophthalmic solution 0.2% 1 drop to right eye twice daily.  21. Systane 1 drop to left eye twice daily.  22. Artificial tears ophthalmic solution 1 drop to each eye twice daily as needed.  23. Milk of Magnesia 8% oral suspension 30 mL once daily as needed.  24. Vitamin D2   50 thousand international units 1 capsule once daily.  25. Duragesic 12 mcg transdermally every 72 hours.  26. Norco 325/5 mg 1 tablet every 4 hours as needed.  27. Methotrexate 2.5 mg tablets 4 tablets once weekly on Fridays.  28. Tylenol 325 mg 2 tablets every 4 hours as needed.  29. Guaifenesin 600 mg p.o. twice daily.  30. Fluticasone/salmeterol 250/50 one puff twice daily.  31. Albuterol/ipratropium 2.5 mg/0.5 mg in 3 mL inhalation solution 1 inhalation 6 times daily as needed.  32. Docusate sodium 100 mg p.o. twice daily as needed.  33. Levaquin 750 mg every 48 hours for 7 more days.   HOME OXYGEN: None.   DIET: 2 gram salt, low-fat, low-cholesterol, mechanical soft.   ACTIVITY LIMITATIONS: As tolerated.    FOLLOWUP: Appointment with Dr. Venia Minks in 2 days after discharge.  CONSULTANTS:  1. Care management.  2. Social work.  3. Palliative care, Mr. Altha Harm, Dr. Izora Gala Phifer.  4. Pulmonologist, Dr. Flora Lipps. 5. Surgeon, Dr. Sherri Rad.  RADIOLOGIC STUDIES:  Chest x-ray, portable, single view, 10/01/2013, showed chronic lung changes, low lung volumes with vascular crowding,  bibasilar atelectasis.  CT scan of head without contrast, 10/01/2013, revealed no acute intracranial abnormality. No change since prior study. Moderate atrophy and advanced chronic microvascular ischemic changes were noted.  CT angiography of the chest for pulmonary embolism, 10/01/2013, showed new right hilar mass worrisome for malignancy. It produces mass effect upon branches of the right upper lobe bronchus and branches of the right pulmonary artery to the upper lobe. There is  mild mass effect upon the right main bronchus as well. There is no discrete pulmonary parenchymal mass. There are emphysematous and fibrotic changes bilaterally, and there are patchy areas of subpleural density bilaterally, which are more conspicuous than in the past and may reflect pneumonia. These results were called by telephone at the time of interpretation to Dr. Lenise Arena, who acknowledged results.  Ultrasound of left lower extremity, 10/02/2013, revealing no evidence of DVT. Large complex fluid collection in the medial left calf, worrisome for hematoma. An inflammatory process was not excluded, according to radiology.  Repeated chest x-ray, PA and lateral, 10/07/2013, revealed slight interval improvement in the appearance of the pulmonary interstitium since the prior study.   HOSPITAL COURSE: The patient is a 81 year old Caucasian male with past medical history significant for history of multiple medical problems, including coronary artery disease, atrial fibrillation, hypertension, hyperlipidemia, who presented to the hospital with complaints of weakness, confusion as well as hypoxia. Please refer to Dr. Boykin Reaper admission note on 10/01/2013.   On arrival to the hospital, the patient was noted to be short of breath, coughing, hypoxic at 88% on room air. His vital signs: Temperature was 99.7, pulse was 95, respiratory rate was not checked, blood pressure 122/58, saturation was 88% on room air and 98% on 3 liters of oxygen by nasal cannula. On exam, he had bilateral crackles all over his lungs, with decreased air entry, and no wheezing, but increased work of breathing. The patient's lab data done on arrival to the hospital on 10/01/2013 revealed elevated BUN to 19, sodium 132, potassium 3.4, glucose 147, beta natriuretic peptide was 937. Otherwise, BMP was unremarkable. Albumin level was low at 3.1, otherwise comprehensive metabolic panel was unremarkable. Cardiac enzymes x1 were within normal  limits. White blood cell count was 7.3, hemoglobin was 10.1, platelet count 151. Coagulation panel was unremarkable. The patient's blood cultures taken on 10/01/2013 did not show any growth. Urinalysis was remarkable for 3 red blood cells and less than 1 white blood cell. The pH of venous blood was 7.40, pCO2 was 46. EKG: Initial EKG done in the Emergency Room revealed sinus rhythm with premature atrial complexes at 91 beats per minute, anteroseptal infarct which was mentioned in April 2005, but no acute ST-T changes were noted. Chest x-ray was unremarkable except for poor lung volumes. CT scan of the chest showed new right hilar mass as well as possible pneumonia. The patient was admitted to the hospital for further evaluation and treatment for pneumonia.  First, in regards to acute hypoxic respiratory failure, the patient was continued on oxygen therapy, and his oxygen was weaned down. It was felt that the patient's acute respiratory failure was related to bilateral pneumonia. We were able to treat it with antibiotics, and his condition improved. His chest x-ray also improved. He was noted to be wheezing more on one side, on the right side than on the left. He was initiated on steroids IV while in the hospital as well as inhalation therapy, with improvement of his wheezing. It was felt that the patient had  COPD exacerbation due to pneumonia.   Due to crowding on the chest x-ray as well as elevated BNP level, it was also felt that the patient had mild exacerbation of his chronic diastolic CHF. The patient was continued on medications. Did not require any additional doses of Lasix while he was in the hospital, and his condition improved by itself. He is to continue Lasix at home or in facility, watching him carefully for dehydration since his oral intake was unpredictable.   In regards to acute posthemorrhagic anemia, the patient was noted to be bleeding to left calf. He had mild drop of hemoglobin level from  admission level of 10.1 on 10/01/2013, dropped down to 8.5 on 10/02/2013. The patient did have abnormalities of his left calf. Ultrasound of his left leg was performed, which revealed fluid collection in medial left calf. Consultation with Dr. Marina Gravel was obtained, surgeon, who felt that the patient very likely had left calf hematoma and likely ruptured varix. He recommended elevation as well as ice and compression if the patient is able to tolerate. With this therapy, the patient's condition relatively stabilized. He had no pain or bleeding from the site. His hemoglobin remained stable.   In regards to right hilar mass, the patient's right hilar mass was discussed with the patient's family, and palliative care was involved in decision-making. The patient's family decided against aggressive therapy or evaluation of this mass and decided to initially take father to rehabilitation facility and later to transition to hospice care if needed. The patient is being discharged to skilled nursing facility for further therapy and management; however, no further investigation of this right hilar mass will be entertained.   The patient was noted to be hyponatremic. It was felt that the hyponatremia could have been related to SIADH due to suspected lung cancer. We were not able, however, to get osmolalities of his urine prior to discharge from the hospital. However, we recommend to watch his oral intake and follow his sodium levels intermittently to make sure that his sodium remains stable and have him on fluid restrictions if needed, especially if his urine osmolality is relatively high.   In regards to history of coronary artery disease, atrial fibrillation, hypertension, diastolic CHF, anemia of chronic disease, hyperlipidemia, hypothyroidism, depression and glaucoma, the patient is to continue his outpatient medications. No changes were made.   He is being discharged to skilled nursing facility, rehabilitation facility  in good condition with the above-mentioned medications and followup.   On the day of discharge, temperature was 98, pulse was 98, respiratory rate was 20, blood pressure 132/84, saturation was 94% on room air at rest.   TIME SPENT: 40 minutes.   ____________________________ Theodoro Grist, MD rv:lb D: 10/07/2013 12:30:23 ET T: 10/07/2013 13:12:32 ET JOB#: [id number]  cc: Theodoro Grist, MD, <Dictator> Jerrell Belfast, MD [place] MD ELECTRONICALLY SIGNED 10/17/2013 18:26

## 2014-08-14 NOTE — H&P (Signed)
PATIENT NAME:  Aaron Cook, Aaron Cook MR#:  [id number] DATE OF BIRTH:  [birth year]  DATE OF ADMISSION:  10/01/2013  PRIMARY CARE PHYSICIAN: Dr. Venia Minks.   ONCOLOGIST/HEMATOLOGIST: Dr. Grayland Ormond.   CHIEF COMPLAINT: Hypoxia, weakness, confusion.   HISTORY OF PRESENT ILLNESS: This is a 81 year old Caucasian male patient with history of coronary artery disease, diastolic congestive heart failure, atrial fibrillation, hypertension, and dementia, presents to the Emergency Room from [facility] with some confusion, hypoxia and weakness. The patient does have dementia, has been progressively worsening over the past few months per daughter, but was found to be short of breath, coughing hypoxic at 88% on room air and was brought to the Emergency Room. Here, the patient has been found to have bilateral pneumonia along with right hilar mass  of 4 x 2 cm in the lung on CAT scan but no pulmonary embolism. The patient is being admitted to the hospitalist service for bilateral pneumonia and acute respiratory failure and acute encephalopathy.   Daughter at bedside gives most of the history. Also I have reviewed old records. The patient is hard of hearing and also with the confusion is unable to contribute to the history.   PAST MEDICAL HISTORY: 1. Coronary artery disease.  2. Hyperlipidemia.  3. Hypertension.  4. Hypothyroidism.  5. Atrial fibrillation.  6. Diastolic congestive heart failure.  7. GERD.  8. Arthritis.  9. Depression.  10. Prostate cancer status post seeds.  12. Hernia repair.  13. Bilateral cataract.  14. Bilateral shoulder replacement.   FAMILY HISTORY: No family history of cancer.   SOCIAL HISTORY: The patient smoked when he was young, but quit when he was 34. Does not drink any alcohol, presently resides at a nursing home.  CODE STATUS: DNR/DNI.   REVIEW OF SYSTEMS: Unobtainable secondary to the patient's confusion dementia.   HOME MEDICATIONS: 1. Calcium carbonate 1  tablet oral once a day.  2. Duragesic 12 transdermal film every three days.  3. Flomax 0.4 mg daily.  4. Flonase 50 mcg two sprays nasal once a day.  5. Folic acid  1 mg 3 tablets once a day.  6. Fosamax 70 mg oral once a day. 7. Guaifenesin 30 mL oral every four hours as needed for cough.  8. Lasix 20 mg daily.  9. Methotrexate 2.5 mg 4 tablets oral once a week on Friday for rheumatoid arthritis.  10. Norco 325/5, 1 tablet every four hours as needed for pain.  11. Paxil 20 mg daily.  12. Potassium chloride 10 mEq daily.  13. Prednisone 15 mg oral once a day.  14. Prilosec 20 mg oral once a day.  15. Proscar 5 mg oral once a day.  16. Singulair 10 mg oral once a day.  17. Synthroid 137 mcg once a day.  18. Tylenol 325, 2 tablets every four hours as needed for pain and fever.  19. Vitamin B12 500 mcg  2 tablets daily.  20. Vitamin D2 50,000 international units oral once a day.  21. Vitamin D3 2000 international units oral once a day.  22. Zyrtec 10 mg oral once a day.   ALLERGIES: ATROPINE, SCOPOLAMINE.   PHYSICAL EXAMINATION:  VITAL SIGNS: Temperature 99.7, pulse of 95 with a blood pressure of 122/58, saturating 88% on room air and 98% on 3 liters oxygen.  GENERAL: Elderly Caucasian male patient sitting up in bed in respiratory distress, coughing. He is restless, seems confused. She is drowsy.  HEENT: Atraumatic, normocephalic. Oral mucosa dry and pink.  No oral ulcers or thrush. Pallor positive. No icterus. Pupils bilaterally equal and reactive to light. Has decreased hearing.  NECK: Supple. No thyromegaly or palpable lymph nodes.  CARDIOVASCULAR: S1, S2, without any murmurs. Has 1+ edema.  RESPIRATORY: Has bilateral crackles, all over with decreased air entry. No wheezing. Has increased work of breathing.  GASTROINTESTINAL: Soft abdomen, nontender. Bowel sounds present. No hepatosplenomegaly palpable.  SKIN: Warm and dry. No petechiae, rash, ulcers. Has some bruising in lower  extremities.  MUSCULOSKELETAL: No joint swelling, redness in large joints. Normal muscle tone.  NEUROLOGIC: Motor strength 5/5 in upper extremities.  LYMPHATIC: No cervical lymphadenopathy.   LABORATORY, DIAGNOSTIC, AND RADIOLOGICAL DATA: BUN 19, creatinine 0.99, sodium 138, potassium 3.4 with GFR greater than 60. AST, ALT, alkaline phosphatase, bilirubin normal. Troponin less than 0.02.   WBC 7.3, hemoglobin 10.1 with platelets of 151.   Urinalysis shows no bacteria. His venous pH of 7.4 with pCO2 46.   EKG shows normal sinus rhythm, nothing acute.   Chest x-ray showed bilateral atelectasis. No pulmonary edema.   CT scan of the chest done for pulmonary embolism shows bilateral pneumonia along with a right hilar mass of 4.1 into 2.1 cm. This is pushing on the right upper lobe bronchus and also a right mainstem bronchus, but no obstruction found. There is no pulmonary edema.   ASSESSMENT AND PLAN:  1. Bilateral pneumonia with acute respiratory failure in a patient from nursing home and on chronic steroids. We will start him on antibiotics for healthcare acquired pneumonia with resistant organisms. Get blood and sputum cultures. The patient will be admitted onto a medical floor. Continue oxygen, put him on nebulizer treatments. While the patient is going to through the acute infection, we will switch his prednisone to IV higher dose steroids.  2. Right hilar mass. The patient's last CT scan of the chest was in August 2014 when there was no mass found, but this has rapidly increased to 4 x 2 cm in the last 9 months. This is very likely malignant. I have discussed with the daughter regarding options. She did ask me about biopsy but I have advised against this as the patient with his multiple comorbidities and advanced age would not be able to tolerate chemo and surgery. I have discussed with her regarding his quality of life and I have also discussed with Dr. Ermalinda Memos who will be seeing the patient.  Also consult pulmonary for their input.  3. Acute encephalopathy over dementia secondary to the acute infection  4. Coronary artery disease, stable.  5. Congestive heart failure. Does not have any pulmonary edema. Chest x-ray does not seem fluid overloaded. Monitor for fluid overload.  6. Chronic anemia, stable.  7. Mild hyponatremia and hypokalemia secondary to dehydration. Start on IV fluids. 8. Deep vein thrombosis prophylaxis with Lovenox.  9. CODE STATUS: DNR/DNI.   TIME SPENT TODAY ON THIS CASE: Was 50 minutes.   ____________________________ Leia Alf Lakiyah Arntson, MD srs:sg D: 10/01/2013 13:45:08 ET T: 10/01/2013 14:05:48 ET JOB#: [id number]  cc: Alveta Heimlich R. Roe Wilner, MD, <Dictator> Jerrell Belfast, MD Efraim Kaufmann, MD Kathlene November. Grayland Ormond, MD  Neita Carp MD ELECTRONICALLY SIGNED 10/01/2013 16:41

## 2014-11-11 ENCOUNTER — Encounter: Payer: Self-pay | Admitting: *Deleted

## 2014-11-15 ENCOUNTER — Ambulatory Visit: Payer: Self-pay | Admitting: Urology

## 2014-11-27 IMAGING — CT CT CHEST W/ CM
1 series · 15 of 33 positions shown, 19 images · IV contrast (isovue)
Comparison: 05/01/2012

REASON FOR EXAM: reassess interval change of pulmonary densities
COMMENTS:

PROCEDURE:     CT  - CT CHEST WITH CONTRAST  - December 03, 2012 [DATE]
RESULT:     Indication: Pulmonary densities
TECHNIQUE: Multiple axial images of the chest are obtained with Senokot
milliliters of Isovue 300 intravenous contrast.

[Series 2: soft tissue · axial · 0.75mm/px · z∈[-486,-252]mm · 15 of 92 slices shown, 19 images]
[im 7/92  mediastinal]
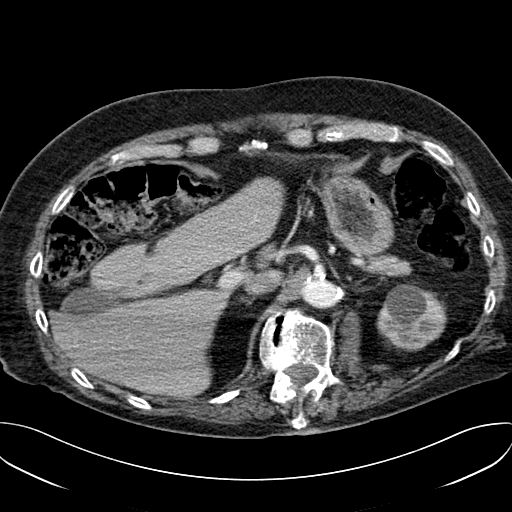
[im 7/92  lung]
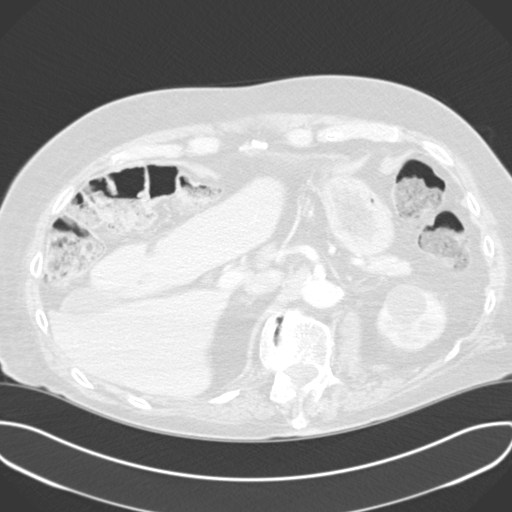
[im 14/92  lung]
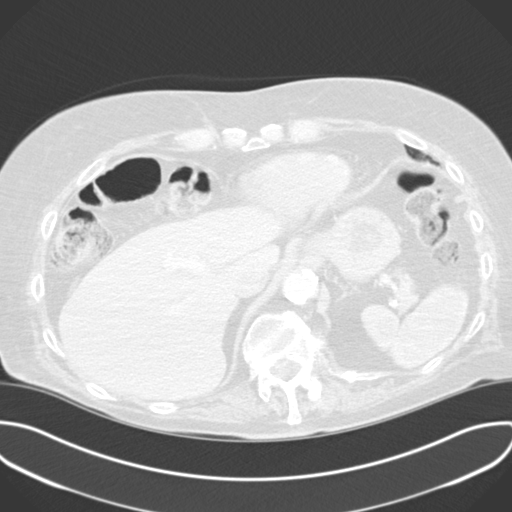
[im 19/92  lung]
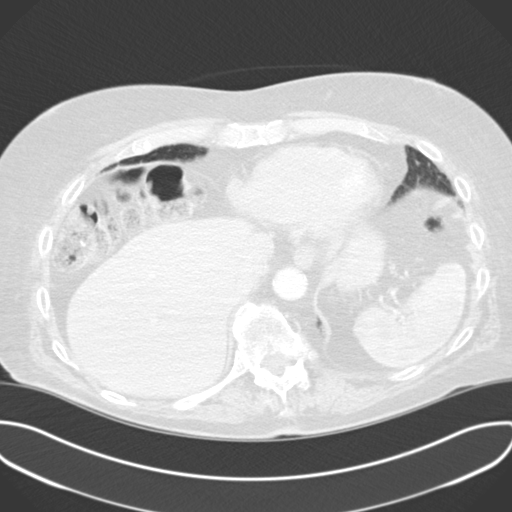
[im 24/92  lung]
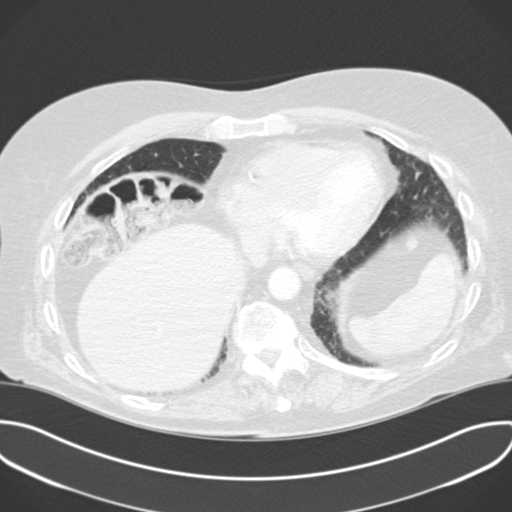
[im 31/92  mediastinal]
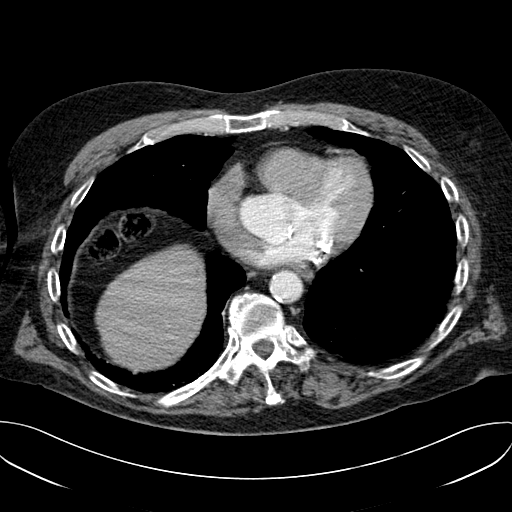
[im 31/92  lung]
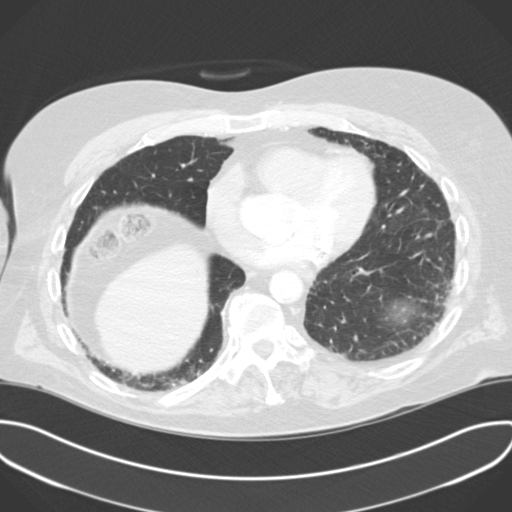
[im 37/92  lung]
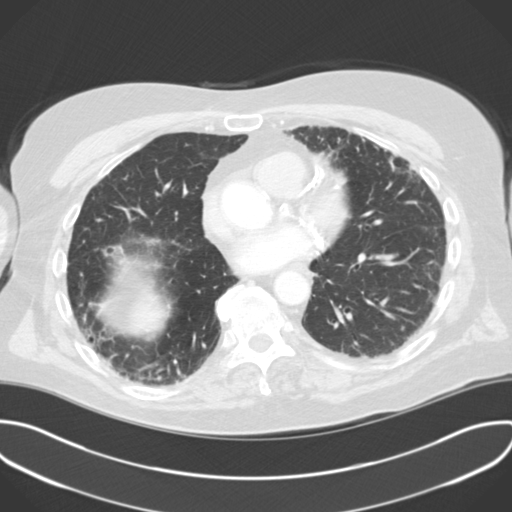
[im 41/92  lung]
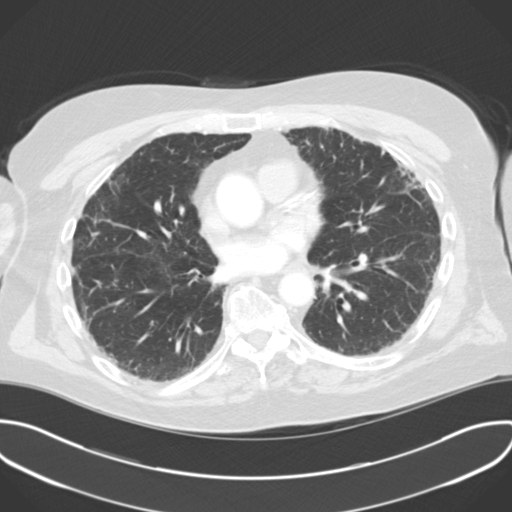
[im 48/92  lung]
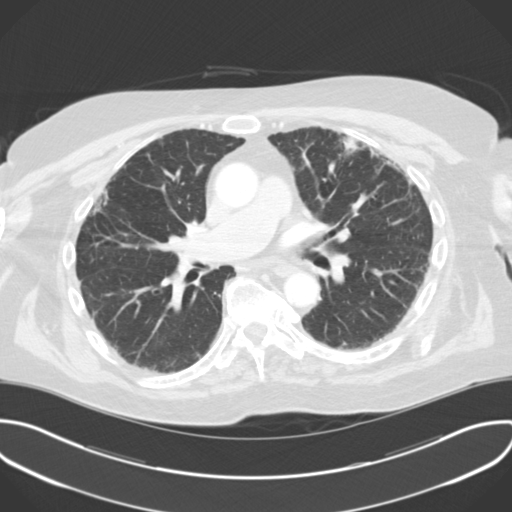
[im 51/92  mediastinal]
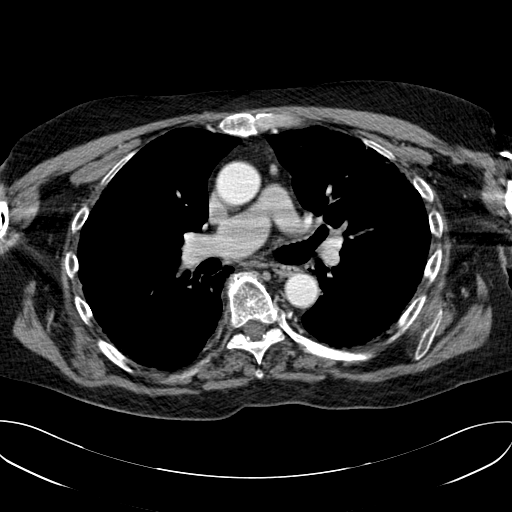
[im 51/92  lung]
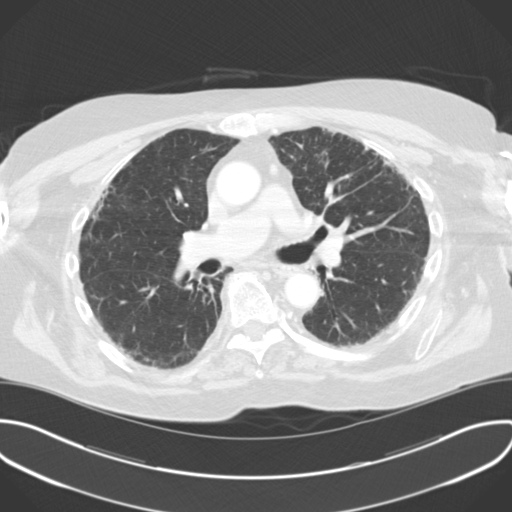
[im 55/92  lung]
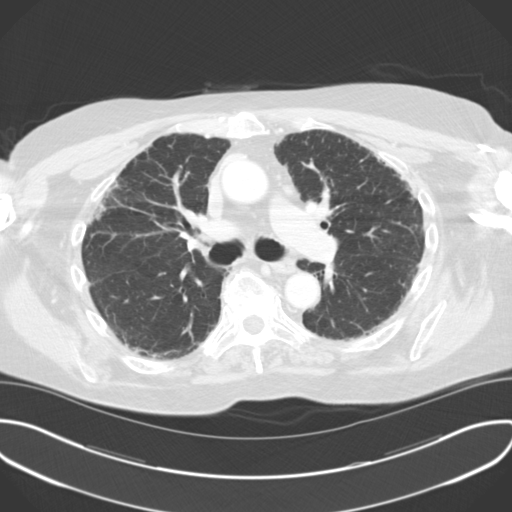
[im 61/92  lung]
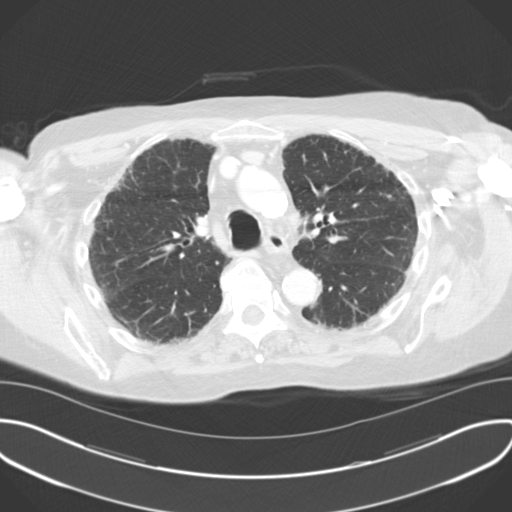
[im 68/92  lung]
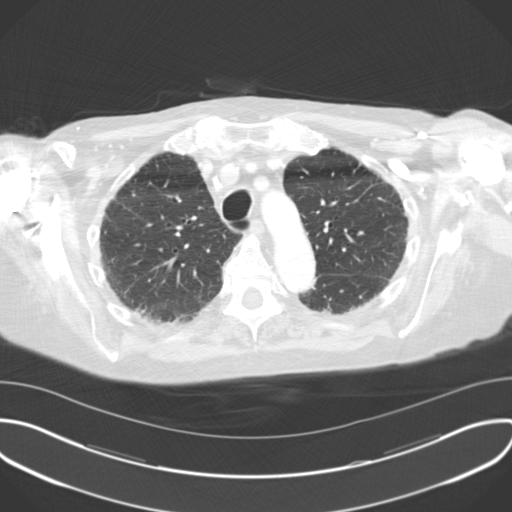
[im 73/92  mediastinal]
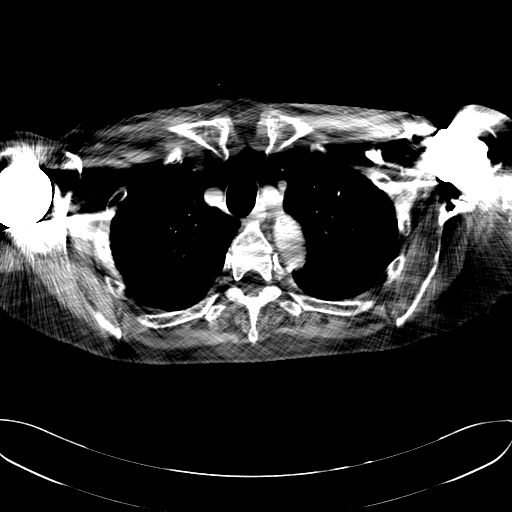
[im 73/92  lung]
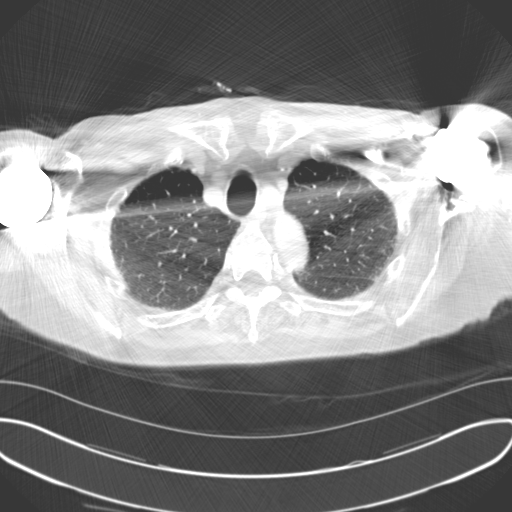
[im 78/92  lung]
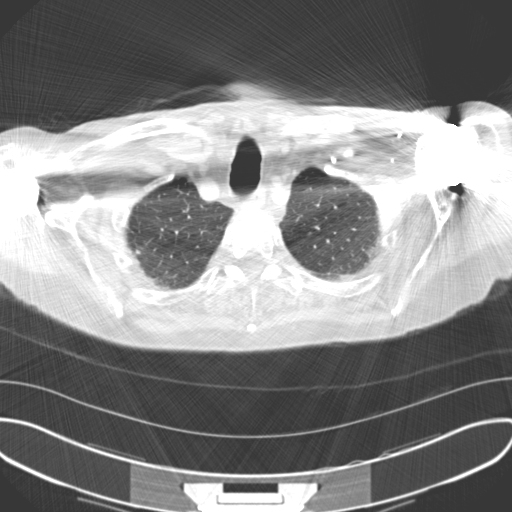
[im 85/92  lung]
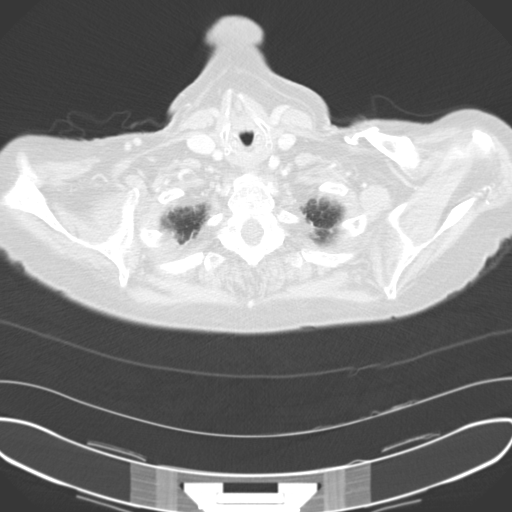

[15 of 33 positions shown; findings below may reference images not displayed]

FINDINGS: The central airways are patent. There is bilateral subpleural interstitial
thickening involving the upper and lower lobes most consistent with chronic
interstitial lung disease similar in appearance to the prior examination.
The previously demonstrated patchy airspace opacities have resolved. There
is no pleural effusion or pneumothorax.

There are no pathologically enlarged axillary, hilar, or mediastinal lymph
nodes.

The heart size is normal. There is no pericardial effusion. The thoracic
aorta is normal in caliber.  There is severe coronary artery atherosclerosis.

Review of bone windows demonstrates no focal lytic or sclerotic lesions.
There are bilateral Shoulder arthroplasties.

Limited noncontrast images of the upper abdomen were obtained. The adrenal
glands appear normal. The remainder of the visualized abdominal organs are
unremarkable.
IMPRESSION: 1. No acute disease of the chest.
2. Coronary artery disease.

[REDACTED]

## 2014-12-15 ENCOUNTER — Emergency Department: Payer: Medicare Other

## 2014-12-15 ENCOUNTER — Other Ambulatory Visit: Payer: Self-pay

## 2014-12-15 ENCOUNTER — Encounter: Payer: Self-pay | Admitting: Emergency Medicine

## 2014-12-15 ENCOUNTER — Emergency Department
Admission: EM | Admit: 2014-12-15 | Discharge: 2014-12-16 | Disposition: A | Discharge status: Home / Self Care | Payer: Medicare Other | Attending: Emergency Medicine | Admitting: Emergency Medicine

## 2014-12-15 DIAGNOSIS — Z7951 Long term (current) use of inhaled steroids: Secondary | ICD-10-CM | POA: Insufficient documentation

## 2014-12-15 DIAGNOSIS — W19XXXA Unspecified fall, initial encounter: Secondary | ICD-10-CM | POA: Diagnosis not present

## 2014-12-15 DIAGNOSIS — E119 Type 2 diabetes mellitus without complications: Secondary | ICD-10-CM | POA: Insufficient documentation

## 2014-12-15 DIAGNOSIS — S0990XA Unspecified injury of head, initial encounter: Secondary | ICD-10-CM | POA: Diagnosis present

## 2014-12-15 DIAGNOSIS — Z79899 Other long term (current) drug therapy: Secondary | ICD-10-CM | POA: Insufficient documentation

## 2014-12-15 DIAGNOSIS — Z7952 Long term (current) use of systemic steroids: Secondary | ICD-10-CM | POA: Insufficient documentation

## 2014-12-15 DIAGNOSIS — Y939 Activity, unspecified: Secondary | ICD-10-CM | POA: Insufficient documentation

## 2014-12-15 DIAGNOSIS — Y998 Other external cause status: Secondary | ICD-10-CM | POA: Insufficient documentation

## 2014-12-15 DIAGNOSIS — F0390 Unspecified dementia without behavioral disturbance: Secondary | ICD-10-CM | POA: Diagnosis not present

## 2014-12-15 DIAGNOSIS — IMO0002 Reserved for concepts with insufficient information to code with codable children: Secondary | ICD-10-CM

## 2014-12-15 DIAGNOSIS — Y92129 Unspecified place in nursing home as the place of occurrence of the external cause: Secondary | ICD-10-CM | POA: Insufficient documentation

## 2014-12-15 DIAGNOSIS — Z23 Encounter for immunization: Secondary | ICD-10-CM | POA: Insufficient documentation

## 2014-12-15 DIAGNOSIS — S0101XA Laceration without foreign body of scalp, initial encounter: Secondary | ICD-10-CM | POA: Diagnosis not present

## 2014-12-15 LAB — CBC WITH DIFFERENTIAL/PLATELET
BASOS PCT: 1 %
Basophils Absolute: 0 10*3/uL (ref 0–0.1)
Eosinophils Absolute: 0 10*3/uL (ref 0–0.7)
Eosinophils Relative: 1 %
HCT: 27.4 % — ABNORMAL LOW (ref 40.0–52.0)
HEMOGLOBIN: 9.1 g/dL — AB (ref 13.0–18.0)
Lymphocytes Relative: 33 %
Lymphs Abs: 1.4 10*3/uL (ref 1.0–3.6)
MCH: 37.8 pg — ABNORMAL HIGH (ref 26.0–34.0)
MCHC: 33.3 g/dL (ref 32.0–36.0)
MCV: 113.3 fL — ABNORMAL HIGH (ref 80.0–100.0)
MONOS PCT: 4 %
Monocytes Absolute: 0.2 10*3/uL (ref 0.2–1.0)
NEUTROS PCT: 61 %
Neutro Abs: 2.7 10*3/uL (ref 1.4–6.5)
Platelets: 218 10*3/uL (ref 150–440)
RBC: 2.42 MIL/uL — ABNORMAL LOW (ref 4.40–5.90)
RDW: 15.3 % — ABNORMAL HIGH (ref 11.5–14.5)
WBC: 4.3 10*3/uL (ref 3.8–10.6)

## 2014-12-15 LAB — URINALYSIS COMPLETE WITH MICROSCOPIC (ARMC ONLY)
BACTERIA UA: NONE SEEN
Bilirubin Urine: NEGATIVE
Glucose, UA: NEGATIVE mg/dL
HGB URINE DIPSTICK: NEGATIVE
Ketones, ur: NEGATIVE mg/dL
LEUKOCYTES UA: NEGATIVE
NITRITE: NEGATIVE
PH: 6 (ref 5.0–8.0)
Protein, ur: NEGATIVE mg/dL
Specific Gravity, Urine: 1.014 (ref 1.005–1.030)
Squamous Epithelial / LPF: NONE SEEN
WBC UA: NONE SEEN WBC/hpf (ref 0–5)

## 2014-12-15 LAB — TROPONIN I: Troponin I: <0.03 ng/mL (ref <0.031)

## 2014-12-15 LAB — COMPREHENSIVE METABOLIC PANEL
ALBUMIN: 3.3 g/dL — AB (ref 3.5–5.0)
ALK PHOS: 48 U/L (ref 38–126)
ALT: 13 U/L — ABNORMAL LOW (ref 17–63)
ANION GAP: 10 (ref 5–15)
AST: 31 U/L (ref 15–41)
BUN: 27 mg/dL — ABNORMAL HIGH (ref 6–20)
CO2: 28 mmol/L (ref 22–32)
Calcium: 9 mg/dL (ref 8.9–10.3)
Chloride: 96 mmol/L — ABNORMAL LOW (ref 101–111)
Creatinine, Ser: 1.03 mg/dL (ref 0.61–1.24)
GFR calc Af Amer: >60 mL/min (ref >60)
GFR calc non Af Amer: >60 mL/min (ref >60)
GLUCOSE: 99 mg/dL (ref 65–99)
POTASSIUM: 4.3 mmol/L (ref 3.5–5.1)
SODIUM: 134 mmol/L — AB (ref 135–145)
Total Bilirubin: 0.6 mg/dL (ref 0.3–1.2)
Total Protein: 7 g/dL (ref 6.5–8.1)

## 2014-12-15 MED ORDER — TETANUS-DIPHTHERIA TOXOIDS TD 5-2 LFU IM INJ
0.5000 mL | INJECTION | Freq: Once | INTRAMUSCULAR | Status: AC
  Administered 2014-12-16: 0.5 mL via INTRAMUSCULAR
  Filled 2014-12-15: qty 0.5

## 2014-12-15 NOTE — ED Provider Notes (Signed)
[facility] Emergency Department Provider Note  ____________________________________________  Time seen: Seen upon arrival to the emergency department    I have reviewed the triage vital signs and the nursing notes.   HISTORY  Chief Complaint Fall    HPI Aaron Cook is a 81 y.o. male with a history of diabetes as well as dementia who presents tonight after an unwitnessed fall. The patient was found by his CNA sitting next to his recliner. She says that the patient was likely there for maximum of 15 minutes. The patient does not recall the events of the fall and is now complaining of only back pain but is unable to specify location. The patient has chronic back pain and his CNA, Ms. Dara Lords, says that that is normal for him to complain of back pain.It is unknown when the patient had his last tetanus shot. His daughter is at the bedside and says that he is acting more confused than his baseline. He usually knows where his room is at his assisted living and he also did not recognize his daughter when she had first entered the room. The patient denies any chest pain or shortness of breath. Unknown if positive for loss of consciousness. Ms. Dara Lords says that the patient has had several falls in the past. He normally ambulates with a walker.   Past Medical History  Diagnosis Date  . Diabetes mellitus   . Incontinence   . Over weight   . BPH (benign prostatic hyperplasia)     There are no active problems to display for this patient.   Past Surgical History  Procedure Laterality Date  . Penile prosthesis placement    . Penile prosthesis  removal    . Shoulder replacement    . Replacement total knee      Current Outpatient Rx  Name  Route  Sig  Dispense  Refill  . acetaminophen (TYLENOL) 325 MG tablet   Oral   Take 1 tablet by mouth every 6 (six) hours.         Marland Kitchen alendronate (FOSAMAX) 70 MG tablet   Oral   Take 70 mg by mouth once a week. Take  with a full glass of water on an empty stomach.         . brimonidine (ALPHAGAN) 0.2 % ophthalmic solution      3 (three) times daily.         . calcium carbonate (OS-CAL) 600 MG TABS tablet   Oral   Take 600 mg by mouth 2 (two) times daily with a meal.         . Cholecalciferol (VITAMIN D) 2000 UNITS CAPS   Oral   Take by mouth daily.         . fentaNYL (DURAGESIC - DOSED MCG/HR) 12 MCG/HR   Transdermal   Place 1 patch onto the skin every 3 (three) days.         . finasteride (PROSCAR) 5 MG tablet   Oral   Take 5 mg by mouth daily.         . fluticasone (FLONASE) 50 MCG/ACT nasal spray   Each Nare   Place into both nostrils daily.         . folic acid (FOLVITE) 1 MG tablet   Oral   Take 1 mg by mouth daily.         . furosemide (LASIX) 20 MG tablet   Oral   Take 20 mg by mouth.         Marland Kitchen  latanoprost (XALATAN) 0.005 % ophthalmic solution      1 drop at bedtime.         Marland Kitchen levothyroxine (SYNTHROID, LEVOTHROID) 137 MCG tablet   Oral   Take 137 mcg by mouth daily before breakfast.         . magnesium hydroxide (MILK OF MAGNESIA) 400 MG/5ML suspension   Oral   Take by mouth daily as needed for mild constipation.         . methotrexate (RHEUMATREX) 2.5 MG tablet   Oral   Take 2.5 mg by mouth once a week. Caution:Chemotherapy. Protect from light.         . montelukast (SINGULAIR) 10 MG tablet   Oral   Take 10 mg by mouth at bedtime.         Marland Kitchen omeprazole (PRILOSEC) 20 MG capsule   Oral   Take 20 mg by mouth daily.         Marland Kitchen PARoxetine (PAXIL) 20 MG tablet   Oral   Take 20 mg by mouth daily.         . potassium chloride (K-DUR) 10 MEQ tablet   Oral   Take 1 tablet by mouth daily.         . predniSONE (DELTASONE) 5 MG tablet   Oral   Take 1 tablet by mouth 3 (three) times daily.         Marland Kitchen Propylene Glycol (SYSTANE BALANCE) 0.6 % SOLN   Ophthalmic   Apply to eye.         . tamsulosin (FLOMAX) 0.4 MG CAPS capsule    Oral   Take 0.4 mg by mouth.         . vitamin B-12 (CYANOCOBALAMIN) 500 MCG tablet   Oral   Take 500 mcg by mouth daily.           Allergies Atropine and Scopolamine  Family History  Problem Relation Age of Onset  . Heart disease Brother   . Heart disease Father     Social History Social History  Substance Use Topics  . Smoking status: Former Research scientist (life sciences)  . Smokeless tobacco: None  . Alcohol Use: No    Review of Systems Constitutional: No fever/chills Eyes: No visual changes. ENT: No sore throat. Cardiovascular: Denies chest pain. Respiratory: Denies shortness of breath. Gastrointestinal: No abdominal pain.  No nausea, no vomiting.  No diarrhea.  No constipation. Genitourinary: Negative for dysuria. Musculoskeletal: Intermittent complaining of back pain. Skin: Negative for rash. Neurological: Negative for headaches, focal weakness or numbness.  10-point ROS otherwise negative.  ____________________________________________   PHYSICAL EXAM:  VITAL SIGNS: ED Triage Vitals  Enc Vitals Group     BP 12/15/14 2232 156/85 mmHg     Pulse Rate 12/15/14 2232 100     Resp 12/15/14 2232 18     Temp 12/15/14 2232 97.9 F (36.6 C)     Temp Source 12/15/14 2232 Oral     SpO2 12/15/14 2232 96 %     Weight 12/15/14 2232 180 lb (81.647 kg)     Height 12/15/14 2232 5\' 9"  (1.753 m)     Head Cir --      Peak Flow --      Pain Score 12/15/14 2231 0     Pain Loc --      Pain Edu? --      Excl. in [place]? --     Constitutional: Alert and oriented. Well appearing and in no acute distress. Eyes: Conjunctivae  are normal. PERRL. EOMI. Head: One centimeter superficial laceration to the left occiput without any active bleeding, pus or induration. Nose: No congestion/rhinnorhea. Mouth/Throat: Mucous membranes are moist.  Oropharynx non-erythematous. Neck: No stridor.  No tenderness to the C-spine. Is ranging his head and neck freely without any signs of pain. Cardiovascular: Normal  rate, regular rhythm. Grossly normal heart sounds.  Good peripheral circulation. Respiratory: Normal respiratory effort.  No retractions. Lungs CTAB. Gastrointestinal: Soft and nontender. No distention. No abdominal bruits. No CVA tenderness. Musculoskeletal: No lower extremity tenderness nor edema.  No joint effusions. Neurologic:  Normal speech and language. No gross focal neurologic deficits are appreciated. No gait instability. Skin:  Skin is warm, dry and intact. No rash noted. Multiple ecchymosis to the bilateral upper and lower extremities. Appear old. Psychiatric: Mood and affect are normal. Speech and behavior are normal.  ____________________________________________   LABS (all labs ordered are listed, but only abnormal results are displayed)  Labs Reviewed  CBC WITH DIFFERENTIAL/PLATELET - Abnormal; Notable for the following:    RBC 2.42 (*)    Hemoglobin 9.1 (*)    HCT 27.4 (*)    MCV 113.3 (*)    MCH 37.8 (*)    RDW 15.3 (*)    All other components within normal limits  COMPREHENSIVE METABOLIC PANEL - Abnormal; Notable for the following:    Sodium 134 (*)    Chloride 96 (*)    BUN 27 (*)    Albumin 3.3 (*)    ALT 13 (*)    All other components within normal limits  TROPONIN I  URINALYSIS COMPLETEWITH MICROSCOPIC ([facility] ONLY)   ____________________________________________  EKG  ED ECG REPORT I, Doran Stabler, the attending physician, personally viewed and interpreted this ECG.   Date: 12/15/2014  EKG Time: 2240  Rate: 101  Rhythm: sinus tachycardia  Axis: Normal axis  Intervals:none  ST&T Change: No ST segment elevation or depression. No abnormal T-wave inversion.  ____________________________________________  RADIOLOGY  No acute intracranial abnormalities. No acute displaced fractures in the C-spine. ____________________________________________   PROCEDURES   ____________________________________________   INITIAL IMPRESSION / ASSESSMENT  AND PLAN / ED COURSE  Pertinent labs & imaging results that were available during my care of the patient were reviewed by me and considered in my medical decision making (see chart for details).  ----------------------------------------- 11:50 PM on 12/15/2014 -----------------------------------------  Patient awaiting remainder of lab workup. Daughter is at the bedside. Signed out to Dr. Marcelene Butte will follow-up with the patient's remaining lab and imaging studies. We'll not suture the laceration as it is under the hairline and is small and well approximated. Feel that it will heal well on its own without any further intervention. ____________________________________________   FINAL CLINICAL IMPRESSION(S) / ED DIAGNOSES  Acute fall. Acute laceration to the occiput. Initial visit.    Orbie Pyo, MD 12/15/14 [id number]

## 2014-12-15 NOTE — ED Notes (Signed)
Pt's daughter at bedside. Per pt's daughter, pt is at baseline and pt has hx of dementia, which has worsened over the past few months.

## 2014-12-15 NOTE — ED Notes (Signed)
Patient transported to X-ray 

## 2014-12-15 NOTE — ED Notes (Signed)
MD at bedside for eval.

## 2014-12-15 NOTE — ED Notes (Signed)
Pt arrives via EMS from [facility]. Pt sent for unwitnessed fall. Pt with laceration and blood to back of head. Pt c/o chronic back pain, which has hx of back pain. Pt alert and oriented to person. Pt disoriented to place and situation. Pt states, "I am not sure what happened tonight." Pt denies pain at this time. NAD noted. RR even and nonlabored. Per EMS, [employer] stated pt's mental status is at baseline.

## 2014-12-16 ENCOUNTER — Emergency Department: Payer: Medicare Other

## 2014-12-16 DIAGNOSIS — S0101XA Laceration without foreign body of scalp, initial encounter: Secondary | ICD-10-CM | POA: Diagnosis not present

## 2014-12-16 NOTE — ED Notes (Signed)
Pt incontinent of urine. Pt cleaned and clean, dry incontinence pad applied to patient.

## 2014-12-16 NOTE — Discharge Instructions (Signed)
Contusion A contusion is a deep bruise. Contusions are the result of an injury that caused bleeding under the skin. The contusion may turn blue, purple, or yellow. Minor injuries will give you a painless contusion, but more severe contusions may stay painful and swollen for a few weeks.  CAUSES  A contusion is usually caused by a blow, trauma, or direct force to an area of the body. SYMPTOMS   Swelling and redness of the injured area.  Bruising of the injured area.  Tenderness and soreness of the injured area.  Pain. DIAGNOSIS  The diagnosis can be made by taking a history and physical exam. An X-ray, CT scan, or MRI may be needed to determine if there were any associated injuries, such as fractures. TREATMENT  Specific treatment will depend on what area of the body was injured. In general, the best treatment for a contusion is resting, icing, elevating, and applying cold compresses to the injured area. Over-the-counter medicines may also be recommended for pain control. Ask your caregiver what the best treatment is for your contusion. HOME CARE INSTRUCTIONS   Put ice on the injured area.  Put ice in a plastic bag.  Place a towel between your skin and the bag.  Leave the ice on for 15-20 minutes, 3-4 times a day, or as directed by your health care provider.  Only take over-the-counter or prescription medicines for pain, discomfort, or fever as directed by your caregiver. Your caregiver may recommend avoiding anti-inflammatory medicines (aspirin, ibuprofen, and naproxen) for 48 hours because these medicines may increase bruising.  Rest the injured area.  If possible, elevate the injured area to reduce swelling. SEEK IMMEDIATE MEDICAL CARE IF:   You have increased bruising or swelling.  You have pain that is getting worse.  Your swelling or pain is not relieved with medicines. MAKE SURE YOU:   Understand these instructions.  Will watch your condition.  Will get help right  away if you are not doing well or get worse. Document Released: 01/17/2005 Document Revised: 04/14/2013 Document Reviewed: 02/12/2011 [facility] Patient Information 2015 [place], [place]. This information is not intended to replace advice given to you by your health care provider. Make sure you discuss any questions you have with your health care provider.  Fall Prevention and Home Safety Falls cause injuries and can affect all age groups. It is possible to use preventive measures to significantly decrease the likelihood of falls. There are many simple measures which can make your home safer and prevent falls. OUTDOORS  Repair cracks and edges of walkways and driveways.  Remove high doorway thresholds.  Trim shrubbery on the main path into your home.  Have good outside lighting.  Clear walkways of tools, rocks, debris, and clutter.  Check that handrails are not broken and are securely fastened. Both sides of steps should have handrails.  Have leaves, snow, and ice cleared regularly.  Use sand or salt on walkways during winter months.  In the garage, clean up grease or oil spills. BATHROOM  Install night lights.  Install grab bars by the toilet and in the tub and shower.  Use non-skid mats or decals in the tub or shower.  Place a plastic non-slip stool in the shower to sit on, if needed.  Keep floors dry and clean up all water on the floor immediately.  Remove soap buildup in the tub or shower on a regular basis.  Secure bath mats with non-slip, double-sided rug tape.  Remove throw rugs and tripping  hazards from the floors. BEDROOMS  Install night lights.  Make sure a bedside light is easy to reach.  Do not use oversized bedding.  Keep a telephone by your bedside.  Have a firm chair with side arms to use for getting dressed.  Remove throw rugs and tripping hazards from the floor. KITCHEN  Keep handles on pots and pans turned toward the center of the stove. Use  back burners when possible.  Clean up spills quickly and allow time for drying.  Avoid walking on wet floors.  Avoid hot utensils and knives.  Position shelves so they are not too high or low.  Place commonly used objects within easy reach.  If necessary, use a sturdy step stool with a grab bar when reaching.  Keep electrical cables out of the way.  Do not use floor polish or wax that makes floors slippery. If you must use wax, use non-skid floor wax.  Remove throw rugs and tripping hazards from the floor. STAIRWAYS  Never leave objects on stairs.  Place handrails on both sides of stairways and use them. Fix any loose handrails. Make sure handrails on both sides of the stairways are as long as the stairs.  Check carpeting to make sure it is firmly attached along stairs. Make repairs to worn or loose carpet promptly.  Avoid placing throw rugs at the top or bottom of stairways, or properly secure the rug with carpet tape to prevent slippage. Get rid of throw rugs, if possible.  Have an electrician put in a light switch at the top and bottom of the stairs. OTHER FALL PREVENTION TIPS  Wear low-heel or rubber-soled shoes that are supportive and fit well. Wear closed toe shoes.  When using a stepladder, make sure it is fully opened and both spreaders are firmly locked. Do not climb a closed stepladder.  Add color or contrast paint or tape to grab bars and handrails in your home. Place contrasting color strips on first and last steps.  Learn and use mobility aids as needed. Install an electrical emergency response system.  Turn on lights to avoid dark areas. Replace light bulbs that burn out immediately. Get light switches that glow.  Arrange furniture to create clear pathways. Keep furniture in the same place.  Firmly attach carpet with non-skid or double-sided tape.  Eliminate uneven floor surfaces.  Select a carpet pattern that does not visually hide the edge of  steps.  Be aware of all pets. OTHER HOME SAFETY TIPS  Set the water temperature for 120 F (48.8 C).  Keep emergency numbers on or near the telephone.  Keep smoke detectors on every level of the home and near sleeping areas. Document Released: 03/30/2002 Document Revised: 10/09/2011 Document Reviewed: 06/29/2011 [facility] Patient Information 2015 [place], [place]. This information is not intended to replace advice given to you by your health care provider. Make sure you discuss any questions you have with your health care provider. Please return immediately if condition worsens. Please contact her primary physician or the physician you were given for referral. If you have any specialist physicians involved in her treatment and plan please also contact them. Thank you for using [facility].

## 2014-12-16 NOTE — ED Notes (Signed)
Pt returned from CT via stretcher.

## 2014-12-16 NOTE — ED Notes (Signed)
Patient transported to CT 

## 2014-12-16 NOTE — ED Notes (Signed)
Discharge instructions also provided to daughter at bedside. No questions.

## 2014-12-16 NOTE — ED Notes (Signed)
MD at bedside for reeval

## 2014-12-16 NOTE — ED Notes (Signed)
Pt incontinent of urine. Pt cleaned and clean, dry incontinence pad applied.

## 2014-12-16 NOTE — ED Notes (Signed)
Pt transported back to [facility] via EMS. Pt alert and NAD noted. RR even and nonlabored.

## 2014-12-16 NOTE — ED Notes (Signed)
Discharge instructions called to [place] at [facility]. No questions from receiving staff. Pt awaiting EMS transport back to facility.

## 2014-12-16 NOTE — ED Provider Notes (Signed)
Patient was checked out to me by my colleague. The cut on the back of his head is stopped bleeding. His mental status seems to be at baseline for the patient. Review of his CAT scan of his hip for a minor lucency seen by the radiologist does not show any fracture.  CT OF THE RIGHT HIP WITHOUT CONTRAST  TECHNIQUE: Multidetector CT imaging of the right hip was performed according to the standard protocol. Multiplanar CT image reconstructions were also generated.  COMPARISON: Pelvis 12/15/2014  FINDINGS: Diffuse bone demineralization. Degenerative changes in the right hip. Bone cortex and trabecular architecture appear intact. No cortical irregularities or linear lucencies demonstrated. No evidence of acute fracture. Visualized portion of the right hemipelvis is also intact. Seed implants demonstrated in the prostate gland. Vascular calcifications. No soft tissue hematoma.  IMPRESSION: No evidence of acute fracture or dislocation of the right hip.   Felt the patient was stable for discharge.  Daymon Larsen, MD 12/16/14 [phone]

## 2014-12-28 ENCOUNTER — Ambulatory Visit: Payer: Self-pay | Admitting: Urology

## 2015-01-21 ENCOUNTER — Emergency Department
Admission: EM | Admit: 2015-01-21 | Discharge: 2015-01-21 | Disposition: A | Discharge status: Home / Self Care | Payer: Medicare Other | Attending: Emergency Medicine | Admitting: Emergency Medicine

## 2015-01-21 ENCOUNTER — Emergency Department: Payer: Medicare Other

## 2015-01-21 ENCOUNTER — Encounter: Payer: Self-pay | Admitting: Emergency Medicine

## 2015-01-21 DIAGNOSIS — R404 Transient alteration of awareness: Secondary | ICD-10-CM | POA: Diagnosis not present

## 2015-01-21 DIAGNOSIS — S30810A Abrasion of lower back and pelvis, initial encounter: Secondary | ICD-10-CM | POA: Insufficient documentation

## 2015-01-21 DIAGNOSIS — Z7951 Long term (current) use of inhaled steroids: Secondary | ICD-10-CM | POA: Diagnosis not present

## 2015-01-21 DIAGNOSIS — Z87891 Personal history of nicotine dependence: Secondary | ICD-10-CM | POA: Diagnosis not present

## 2015-01-21 DIAGNOSIS — Y9289 Other specified places as the place of occurrence of the external cause: Secondary | ICD-10-CM | POA: Diagnosis not present

## 2015-01-21 DIAGNOSIS — X58XXXA Exposure to other specified factors, initial encounter: Secondary | ICD-10-CM | POA: Insufficient documentation

## 2015-01-21 DIAGNOSIS — Y9389 Activity, other specified: Secondary | ICD-10-CM | POA: Diagnosis not present

## 2015-01-21 DIAGNOSIS — N390 Urinary tract infection, site not specified: Secondary | ICD-10-CM | POA: Insufficient documentation

## 2015-01-21 DIAGNOSIS — Y998 Other external cause status: Secondary | ICD-10-CM | POA: Diagnosis not present

## 2015-01-21 DIAGNOSIS — Z79899 Other long term (current) drug therapy: Secondary | ICD-10-CM | POA: Diagnosis not present

## 2015-01-21 DIAGNOSIS — R4182 Altered mental status, unspecified: Secondary | ICD-10-CM | POA: Diagnosis present

## 2015-01-21 HISTORY — DX: Unspecified atrial fibrillation: I48.91

## 2015-01-21 HISTORY — DX: Major depressive disorder, single episode, unspecified: F32.9

## 2015-01-21 HISTORY — DX: Malignant neoplasm of prostate: C61

## 2015-01-21 HISTORY — DX: Hyperlipidemia, unspecified: E78.5

## 2015-01-21 HISTORY — DX: Atherosclerotic heart disease of native coronary artery without angina pectoris: I25.10

## 2015-01-21 HISTORY — DX: Pneumonia, unspecified organism: J18.9

## 2015-01-21 HISTORY — DX: Unspecified glaucoma: H40.9

## 2015-01-21 HISTORY — DX: Hypothyroidism, unspecified: E03.9

## 2015-01-21 HISTORY — DX: Anemia in other chronic diseases classified elsewhere: D63.8

## 2015-01-21 HISTORY — DX: Heart failure, unspecified: I50.9

## 2015-01-21 HISTORY — DX: Essential (primary) hypertension: I10

## 2015-01-21 HISTORY — DX: Other nonspecific abnormal finding of lung field: R91.8

## 2015-01-21 HISTORY — DX: Depression, unspecified: F32.A

## 2015-01-21 LAB — COMPREHENSIVE METABOLIC PANEL
ALBUMIN: 3.9 g/dL (ref 3.5–5.0)
ALT: 15 U/L — AB (ref 17–63)
AST: 21 U/L (ref 15–41)
Alkaline Phosphatase: 64 U/L (ref 38–126)
Anion gap: 8 (ref 5–15)
BILIRUBIN TOTAL: 0.8 mg/dL (ref 0.3–1.2)
BUN: 21 mg/dL — AB (ref 6–20)
CHLORIDE: 95 mmol/L — AB (ref 101–111)
CO2: 32 mmol/L (ref 22–32)
CREATININE: 1.02 mg/dL (ref 0.61–1.24)
Calcium: 9.3 mg/dL (ref 8.9–10.3)
GFR calc Af Amer: >60 mL/min (ref >60)
GFR calc non Af Amer: >60 mL/min (ref >60)
GLUCOSE: 151 mg/dL — AB (ref 65–99)
POTASSIUM: 4.2 mmol/L (ref 3.5–5.1)
Sodium: 135 mmol/L (ref 135–145)
TOTAL PROTEIN: 7.1 g/dL (ref 6.5–8.1)

## 2015-01-21 LAB — URINALYSIS COMPLETE WITH MICROSCOPIC (ARMC ONLY)
Bilirubin Urine: NEGATIVE
Glucose, UA: NEGATIVE mg/dL
Ketones, ur: NEGATIVE mg/dL
Nitrite: NEGATIVE
PH: 7 (ref 5.0–8.0)
PROTEIN: NEGATIVE mg/dL
SQUAMOUS EPITHELIAL / LPF: NONE SEEN
Specific Gravity, Urine: 1.01 (ref 1.005–1.030)

## 2015-01-21 LAB — CBC
HEMATOCRIT: 28 % — AB (ref 40.0–52.0)
Hemoglobin: 9.3 g/dL — ABNORMAL LOW (ref 13.0–18.0)
MCH: 38.3 pg — ABNORMAL HIGH (ref 26.0–34.0)
MCHC: 33.3 g/dL (ref 32.0–36.0)
MCV: 114.9 fL — AB (ref 80.0–100.0)
PLATELETS: 149 10*3/uL — AB (ref 150–440)
RBC: 2.44 MIL/uL — ABNORMAL LOW (ref 4.40–5.90)
RDW: 17.4 % — AB (ref 11.5–14.5)
WBC: 3.4 10*3/uL — ABNORMAL LOW (ref 3.8–10.6)

## 2015-01-21 LAB — GLUCOSE, CAPILLARY: GLUCOSE-CAPILLARY: 150 mg/dL — AB (ref 65–99)

## 2015-01-21 MED ORDER — CEPHALEXIN 500 MG PO CAPS: 500.0000 mg | ORAL_CAPSULE | Freq: Three times a day (TID) | ORAL | Status: DC

## 2015-01-21 MED ORDER — CEPHALEXIN 500 MG PO CAPS
500.0000 mg | ORAL_CAPSULE | Freq: Once | ORAL | Status: AC
  Administered 2015-01-21: 500 mg via ORAL
  Filled 2015-01-21: qty 1

## 2015-01-21 NOTE — ED Notes (Signed)
Lab called regarding urine culture, will add on at this time

## 2015-01-21 NOTE — ED Notes (Signed)
Pt awaiting transport back to [facility] via EMS. Pt's daughter signed, d/c instructions given to daughter per request. EMS notified by secretary at this time. Pt and family made aware and verbalized understanding at this time

## 2015-01-21 NOTE — ED Provider Notes (Signed)
[facility] Emergency Department Provider Note  ____________________________________________  Time seen: Approximately 3:54 PM  I have reviewed the triage vital signs and the nursing notes.   HISTORY  Chief Complaint Altered Mental Status  History limited by patient's lack of ability to to answer questions  HPI Aaron Cook is a 81 y.o. male patient sent from the nursing home with a history of altered mental status. She does have a history of dementia but is not acting like himself. Patient himself looking smile at you follow most commands but not all of them. He says oow when you roll him over. Fingersticks 150 here   Past Medical History  Diagnosis Date  . Diabetes mellitus   . Incontinence   . Over weight   . BPH (benign prostatic hyperplasia)   . CHF (congestive heart failure)     There are no active problems to display for this patient.   Past Surgical History  Procedure Laterality Date  . Penile prosthesis placement    . Penile prosthesis  removal    . Shoulder replacement    . Replacement total knee      Current Outpatient Rx  Name  Route  Sig  Dispense  Refill  . acetaminophen (TYLENOL) 325 MG tablet   Oral   Take 650 mg by mouth every 4 (four) hours as needed for moderate pain or fever.          Marland Kitchen alendronate (FOSAMAX) 70 MG tablet   Oral   Take 70 mg by mouth once a week. Take with a full glass of water on an empty stomach.         . ALPRAZolam (NIRAVAM) 0.25 MG dissolvable tablet   Oral   Take 0.25 mg by mouth every 6 (six) hours as needed for anxiety.         Marland Kitchen alum & mag hydroxide-simeth (MYLANTA) 200-200-20 MG/5ML suspension   Oral   Take 30 mLs by mouth every 4 (four) hours as needed for indigestion (upset stomach).         . brimonidine (ALPHAGAN) 0.2 % ophthalmic solution   Right Eye   Place 1 drop into the right eye 2 (two) times daily.          . calcium carbonate (TUMS - DOSED IN MG ELEMENTAL  CALCIUM) 500 MG chewable tablet   Oral   Chew 1 tablet by mouth daily.         . cetirizine (ZYRTEC) 10 MG tablet   Oral   Take 10 mg by mouth daily.         . Cholecalciferol 5000 UNITS capsule   Oral   Take 5,000 Units by mouth once a week.         . docusate sodium (COLACE) 100 MG capsule   Oral   Take 100 mg by mouth 2 (two) times daily as needed for mild constipation.          . Ergocalciferol 2000 UNITS TABS   Oral   Take 1 tablet by mouth daily.         . fentaNYL (DURAGESIC - DOSED MCG/HR) 12 MCG/HR   Transdermal   Place 1 patch onto the skin every 3 (three) days.         . ferrous sulfate 325 (65 FE) MG tablet   Oral   Take 325 mg by mouth daily with breakfast.         . finasteride (PROSCAR) 5 MG tablet  Oral   Take 5 mg by mouth daily.         . fluticasone (FLONASE) 50 MCG/ACT nasal spray   Each Nare   Place 2 sprays into both nostrils daily.          . folic acid (FOLVITE) 1 MG tablet   Oral   Take 3 mg by mouth daily.          . furosemide (LASIX) 20 MG tablet   Oral   Take 20 mg by mouth.         Marland Kitchen guaiFENesin (ROBITUSSIN) 100 MG/5ML liquid   Oral   Take 200 mg by mouth every 4 (four) hours as needed for cough.         Marland Kitchen HYDROcodone-acetaminophen (NORCO/VICODIN) 5-325 MG tablet   Oral   Take 1 tablet by mouth every 12 (twelve) hours as needed for moderate pain.         . hydroxypropyl methylcellulose / hypromellose (ISOPTO TEARS / GONIOVISC) 2.5 % ophthalmic solution   Both Eyes   Place 1 drop into both eyes 2 (two) times daily as needed for dry eyes (itchy eyes).         Marland Kitchen ipratropium-albuterol (DUONEB) 0.5-2.5 (3) MG/3ML SOLN   Nebulization   Take 3 mLs by nebulization every 4 (four) hours as needed (cough/ wheezing).         Marland Kitchen latanoprost (XALATAN) 0.005 % ophthalmic solution   Right Eye   Place 1 drop into the right eye at bedtime.          Marland Kitchen levothyroxine (SYNTHROID, LEVOTHROID) 137 MCG tablet    Oral   Take 137 mcg by mouth daily before breakfast.         . magnesium hydroxide (MILK OF MAGNESIA) 400 MG/5ML suspension   Oral   Take 30 mLs by mouth daily as needed for mild constipation.          . Menthol-Zinc Oxide (CALMOSEPTINE EX)   Apply externally   Apply 1 application topically 2 (two) times daily.         . methotrexate (RHEUMATREX) 2.5 MG tablet   Oral   Take 10 mg by mouth once a week. Caution:Chemotherapy. Protect from light.         . montelukast (SINGULAIR) 10 MG tablet   Oral   Take 10 mg by mouth daily.          Marland Kitchen omeprazole (PRILOSEC) 20 MG capsule   Oral   Take 20 mg by mouth daily.         Marland Kitchen PARoxetine (PAXIL) 20 MG tablet   Oral   Take 20 mg by mouth daily.         . potassium chloride (K-DUR) 10 MEQ tablet   Oral   Take 1 tablet by mouth daily.         . predniSONE (DELTASONE) 5 MG tablet   Oral   Take 1 tablet by mouth 3 (three) times daily.         Marland Kitchen Propylene Glycol (SYSTANE BALANCE) 0.6 % SOLN   Left Eye   Place 1 drop into the left eye 2 (two) times daily.          . vitamin B-12 (CYANOCOBALAMIN) 500 MCG tablet   Oral   Take 1,000 mcg by mouth daily.            Allergies Atropine and Scopolamine  Family History  Problem Relation Age of Onset  . Heart  disease Brother   . Heart disease Father     Social History Social History  Substance Use Topics  . Smoking status: Former Research scientist (life sciences)  . Smokeless tobacco: None  . Alcohol Use: No    Review of Systems Unable to obtain ____________________________________________   PHYSICAL EXAM:  VITAL SIGNS: ED Triage Vitals  Enc Vitals Group     BP 01/21/15 1451 132/69 mmHg     Pulse Rate 01/21/15 1451 84     Resp 01/21/15 1451 18     Temp 01/21/15 1451 98.5 F (36.9 C)     Temp Source 01/21/15 1451 Oral     SpO2 01/21/15 1451 95 %     Weight 01/21/15 1451 190 lb (86.183 kg)     Height 01/21/15 1451 5\' 10"  (1.778 m)     Head Cir --      Peak Flow --       Pain Score --      Pain Loc --      Pain Edu? --      Excl. in [place]? --     Constitutional: Alert . Well appearing and in no acute distress. Eyes: Conjunctivae are normal. PERRL. right pupil about 1-2 mm larger than left Head: Atraumatic. Nose: No congestion/rhinnorhea. Mouth/Throat: Mucous membranes are moist.  Oropharynx non-erythematous. Neck: No stridor.  Respiratory: Normal respiratory effort.  No retractions. Lungs CTAB. Cardiac: Heart regular rate and rhythm no audible murmurs  Gastrointestinal: Soft and nontender. No distention. No abdominal bruits. No CVA tenderness. Genitourinary: Patient has some superficial skin abrasions on his buttocks between the sacrum and the rectum.  Musculoskeletal: No lower extremity tenderness nor edema.  No joint effusions. Neurologic: Patient is not talking except to say ow. Patient is not moving the left side very much.. Skin:  Skin is warm, dry and intact. No rash noted.   ____________________________________________   LABS (all labs ordered are listed, but only abnormal results are displayed)  Labs Reviewed  COMPREHENSIVE METABOLIC PANEL - Abnormal; Notable for the following:    Chloride 95 (*)    Glucose, Bld 151 (*)    BUN 21 (*)    ALT 15 (*)    All other components within normal limits  CBC - Abnormal; Notable for the following:    WBC 3.4 (*)    RBC 2.44 (*)    Hemoglobin 9.3 (*)    HCT 28.0 (*)    MCV 114.9 (*)    MCH 38.3 (*)    RDW 17.4 (*)    Platelets 149 (*)    All other components within normal limits  GLUCOSE, CAPILLARY - Abnormal; Notable for the following:    Glucose-Capillary 150 (*)    All other components within normal limits  URINALYSIS COMPLETEWITH MICROSCOPIC ([facility] ONLY)  CBG MONITORING, ED   ____________________________________________  EKG  EKG read and interpreted by me shows normal sinus rhythm at a rate of 85 left axis poor R-wave progression and chest leads but otherwise no acute  changes ____________________________________________  RADIOLOGY   ____________________________________________   PROCEDURES Discussed patient with daughter who reports he's been getting more cantankerous arguing with staff and roaming the halls last few weeks. Been a lot lately his leg swelled up in the last few days. His recliner had broken a been unable to put his legs up without any recliner yesterday. Patient has now returned back to his normal mental status with daughter walked in the room patient looked at her spoke with her that he  is moving both of his arms and legs.   ____________________________________________   INITIAL IMPRESSION / ASSESSMENT AND PLAN / ED COURSE  Pertinent labs & imaging results that were available during my care of the patient were reviewed by me and considered in my medical decision making (see chart for details). Dr. Mamie Nick Will follow-up rest of the tests  ____________________________________________   FINAL CLINICAL IMPRESSION(S) / ED DIAGNOSES  Final diagnoses:  Transient alteration of awareness      Nena Polio, MD 01/21/15 1622

## 2015-01-21 NOTE — ED Provider Notes (Signed)
-----------------------------------------   6:38 PM on 01/21/2015 -----------------------------------------  Patient's urinalysis consistent with urinary tract infection. Will place on antibiotics. I discussed with the daughter the possibility of TIA versus UTI causing symptoms, and offered admission for further workup. She states she would prefer for [place] to treat the urinary tract infection and discharge the patient home to his nursing facility. States given his age and dementia they would be unlikely to pursue any CVA intervention/treatment. Patient is back to his baseline, I believe this is a reasonable approach. We'll discharge patient home with antibiotics.  Harvest Dark, MD 01/21/15 [phone]

## 2015-01-21 NOTE — ED Notes (Signed)
Pt either unable to unwilling to cooperate for neuro assessment. Pt was asked to squeeze fingers of this nurse and he did not seem to comprehend, just looked at me. Then, a few minutes later, he was able to give slight squeeze with his right hand, but did not respond to requests to squeeze with left hand, or to try pushing with feet.

## 2015-01-21 NOTE — Discharge Instructions (Signed)
Altered Mental Status °Altered mental status most often refers to an abnormal change in your responsiveness and awareness. It can affect your speech, thought, mobility, memory, attention span, or alertness. It can range from slight confusion to complete unresponsiveness (coma). Altered mental status can be a sign of a serious underlying medical condition. Rapid evaluation and medical treatment is necessary for patients having an altered mental status. °CAUSES  °· Low blood sugar (hypoglycemia) or diabetes. °· Severe loss of body fluids (dehydration) or a body salt (electrolyte) imbalance. °· A stroke or other neurologic problem, such as dementia or delirium. °· A head injury or tumor. °· A drug or alcohol overdose. °· Exposure to toxins or poisons. °· Depression, anxiety, and stress. °· A low oxygen level (hypoxia). °· An infection. °· Blood loss. °· Twitching or shaking (seizure). °· Heart problems, such as heart attack or heart rhythm problems (arrhythmias). °· A body temperature that is too low or too high (hypothermia or hyperthermia). °DIAGNOSIS  °A diagnosis is based on your history, symptoms, physical and neurologic examinations, and diagnostic tests. Diagnostic tests may include: °· Measurement of your blood pressure, pulse, breathing, and oxygen levels (vital signs). °· Blood tests. °· Urine tests. °· X-ray exams. °· A computerized magnetic scan (magnetic resonance imaging, MRI). °· A computerized X-ray scan (computed tomography, CT scan). °TREATMENT  °Treatment will depend on the cause. Treatment may include: °· Management of an underlying medical or mental health condition. °· Critical care or support in the hospital. °HOME CARE INSTRUCTIONS  °· Only take over-the-counter or prescription medicines for pain, discomfort, or fever as directed by your caregiver. °· Manage underlying conditions as directed by your caregiver. °· Eat a healthy, well-balanced diet to maintain strength. °· Join a support group or  prevention program to cope with the condition or trauma that caused the altered mental status. Ask your caregiver to help choose a program that works for you. °· Follow up with your caregiver for further examination, therapy, or testing as directed. °SEEK MEDICAL CARE IF:  °· You feel unwell or have chills. °· You or your family notice a change in your behavior or your alertness. °· You have trouble following your caregiver's treatment plan. °· You have questions or concerns. °SEEK IMMEDIATE MEDICAL CARE IF:  °· You have a rapid heartbeat or have chest pain. °· You have difficulty breathing. °· You have a fever. °· You have a headache with a stiff neck. °· You cough up blood. °· You have blood in your urine or stool. °· You have severe agitation or confusion. °MAKE SURE YOU:  °· Understand these instructions. °· Will watch your condition. °· Will get help right away if you are not doing well or get worse. °Document Released: 09/27/2009 Document Revised: 07/02/2011 Document Reviewed: 09/27/2009 °ExitCare® Patient Information ©2015 [employer]. This information is not intended to replace advice given to you by your health care provider. Make sure you discuss any questions you have with your health care provider. °Urinary Tract Infection °Urinary tract infections (UTIs) can develop anywhere along your urinary tract. Your urinary tract is your body's drainage system for removing wastes and extra water. Your urinary tract includes two kidneys, two ureters, a bladder, and a urethra. Your kidneys are a pair of bean-shaped organs. Each kidney is about the size of your fist. They are located below your ribs, one on each side of your spine. °CAUSES °Infections are caused by microbes, which are microscopic organisms, including fungi, viruses, and bacteria. These   organisms are so small that they can only be seen through a microscope. Bacteria are the microbes that most commonly cause UTIs. °SYMPTOMS  °Symptoms of UTIs may  vary by age and gender of the patient and by the location of the infection. Symptoms in young women typically include a frequent and intense urge to urinate and a painful, burning feeling in the bladder or urethra during urination. Older women and men are more likely to be tired, shaky, and weak and have muscle aches and abdominal pain. A fever may mean the infection is in your kidneys. Other symptoms of a kidney infection include pain in your back or sides below the ribs, nausea, and vomiting. °DIAGNOSIS °To diagnose a UTI, your caregiver will ask you about your symptoms. Your caregiver also will ask to provide a urine sample. The urine sample will be tested for bacteria and white blood cells. White blood cells are made by your body to help fight infection. °TREATMENT  °Typically, UTIs can be treated with medication. Because most UTIs are caused by a bacterial infection, they usually can be treated with the use of antibiotics. The choice of antibiotic and length of treatment depend on your symptoms and the type of bacteria causing your infection. °HOME CARE INSTRUCTIONS °· If you were prescribed antibiotics, take them exactly as your caregiver instructs you. Finish the medication even if you feel better after you have only taken some of the medication. °· Drink enough water and fluids to keep your urine clear or pale yellow. °· Avoid caffeine, tea, and carbonated beverages. They tend to irritate your bladder. °· Empty your bladder often. Avoid holding urine for long periods of time. °· Empty your bladder before and after sexual intercourse. °· After a bowel movement, women should cleanse from front to back. Use each tissue only once. °SEEK MEDICAL CARE IF:  °· You have back pain. °· You develop a fever. °· Your symptoms do not begin to resolve within 3 days. °SEEK IMMEDIATE MEDICAL CARE IF:  °· You have severe back pain or lower abdominal pain. °· You develop chills. °· You have nausea or vomiting. °· You have  continued burning or discomfort with urination. °MAKE SURE YOU:  °· Understand these instructions. °· Will watch your condition. °· Will get help right away if you are not doing well or get worse. °Document Released: 01/17/2005 Document Revised: 10/09/2011 Document Reviewed: 05/18/2011 °ExitCare® Patient Information ©2015 [employer]. This information is not intended to replace advice given to you by your health care provider. Make sure you discuss any questions you have with your health care provider. ° °

## 2015-01-21 NOTE — ED Notes (Signed)
Pt via ems from [facility]. They called ems due to altered mental status and O2 sats in 80's. EMS states that O2 sats at 95% and above on RA upon their arrival. Pty normally has fentanyl patch but today they did not put it on due to mentation. Pt somewhat responsive but only answers a couple of questions before sleeping/zoning out.

## 2015-01-21 NOTE — ED Notes (Signed)
MD Paduchowski at bedside  

## 2015-01-23 LAB — URINE CULTURE: Culture: >=100000

## 2015-02-28 ENCOUNTER — Encounter: Payer: Self-pay | Admitting: Emergency Medicine

## 2015-02-28 ENCOUNTER — Emergency Department: Payer: Medicare Other

## 2015-02-28 ENCOUNTER — Inpatient Hospital Stay
Admission: EM | Admit: 2015-02-28 | Discharge: 2015-03-04 | DRG: 871 | Disposition: A | Discharge status: Skilled Nursing Facility | Payer: Medicare Other | Attending: Internal Medicine | Admitting: Internal Medicine

## 2015-02-28 DIAGNOSIS — H409 Unspecified glaucoma: Secondary | ICD-10-CM | POA: Diagnosis present

## 2015-02-28 DIAGNOSIS — A419 Sepsis, unspecified organism: Secondary | ICD-10-CM | POA: Diagnosis not present

## 2015-02-28 DIAGNOSIS — E785 Hyperlipidemia, unspecified: Secondary | ICD-10-CM | POA: Diagnosis present

## 2015-02-28 DIAGNOSIS — E860 Dehydration: Secondary | ICD-10-CM | POA: Diagnosis present

## 2015-02-28 DIAGNOSIS — R918 Other nonspecific abnormal finding of lung field: Secondary | ICD-10-CM | POA: Diagnosis present

## 2015-02-28 DIAGNOSIS — I482 Chronic atrial fibrillation: Secondary | ICD-10-CM | POA: Diagnosis present

## 2015-02-28 DIAGNOSIS — G9341 Metabolic encephalopathy: Secondary | ICD-10-CM | POA: Diagnosis present

## 2015-02-28 DIAGNOSIS — D638 Anemia in other chronic diseases classified elsewhere: Secondary | ICD-10-CM | POA: Diagnosis present

## 2015-02-28 DIAGNOSIS — E870 Hyperosmolality and hypernatremia: Secondary | ICD-10-CM | POA: Diagnosis present

## 2015-02-28 DIAGNOSIS — H9190 Unspecified hearing loss, unspecified ear: Secondary | ICD-10-CM | POA: Diagnosis present

## 2015-02-28 DIAGNOSIS — Z96619 Presence of unspecified artificial shoulder joint: Secondary | ICD-10-CM | POA: Diagnosis present

## 2015-02-28 DIAGNOSIS — E876 Hypokalemia: Secondary | ICD-10-CM | POA: Diagnosis present

## 2015-02-28 DIAGNOSIS — Z8546 Personal history of malignant neoplasm of prostate: Secondary | ICD-10-CM

## 2015-02-28 DIAGNOSIS — L8990 Pressure ulcer of unspecified site, unspecified stage: Secondary | ICD-10-CM | POA: Insufficient documentation

## 2015-02-28 DIAGNOSIS — I509 Heart failure, unspecified: Secondary | ICD-10-CM | POA: Diagnosis present

## 2015-02-28 DIAGNOSIS — Z66 Do not resuscitate: Secondary | ICD-10-CM | POA: Diagnosis present

## 2015-02-28 DIAGNOSIS — Z87891 Personal history of nicotine dependence: Secondary | ICD-10-CM

## 2015-02-28 DIAGNOSIS — I2510 Atherosclerotic heart disease of native coronary artery without angina pectoris: Secondary | ICD-10-CM | POA: Diagnosis present

## 2015-02-28 DIAGNOSIS — N179 Acute kidney failure, unspecified: Secondary | ICD-10-CM | POA: Diagnosis present

## 2015-02-28 DIAGNOSIS — F329 Major depressive disorder, single episode, unspecified: Secondary | ICD-10-CM | POA: Diagnosis present

## 2015-02-28 DIAGNOSIS — I110 Hypertensive heart disease with heart failure: Secondary | ICD-10-CM | POA: Diagnosis present

## 2015-02-28 DIAGNOSIS — J189 Pneumonia, unspecified organism: Secondary | ICD-10-CM | POA: Diagnosis present

## 2015-02-28 DIAGNOSIS — D696 Thrombocytopenia, unspecified: Secondary | ICD-10-CM | POA: Diagnosis present

## 2015-02-28 DIAGNOSIS — N400 Enlarged prostate without lower urinary tract symptoms: Secondary | ICD-10-CM | POA: Diagnosis present

## 2015-02-28 DIAGNOSIS — E119 Type 2 diabetes mellitus without complications: Secondary | ICD-10-CM | POA: Diagnosis present

## 2015-02-28 DIAGNOSIS — E039 Hypothyroidism, unspecified: Secondary | ICD-10-CM | POA: Diagnosis present

## 2015-02-28 DIAGNOSIS — Z96659 Presence of unspecified artificial knee joint: Secondary | ICD-10-CM | POA: Diagnosis present

## 2015-02-28 DIAGNOSIS — E872 Acidosis: Secondary | ICD-10-CM | POA: Diagnosis present

## 2015-02-28 DIAGNOSIS — Z888 Allergy status to other drugs, medicaments and biological substances status: Secondary | ICD-10-CM

## 2015-02-28 LAB — CBC WITH DIFFERENTIAL/PLATELET
Basophils Absolute: 0 10*3/uL (ref 0–0.1)
Basophils Relative: 0 %
Eosinophils Absolute: 0 10*3/uL (ref 0–0.7)
Eosinophils Relative: 0 %
HEMATOCRIT: 29.8 % — AB (ref 40.0–52.0)
HEMOGLOBIN: 10.1 g/dL — AB (ref 13.0–18.0)
LYMPHS PCT: 4 %
Lymphs Abs: 0.4 10*3/uL — ABNORMAL LOW (ref 1.0–3.6)
MCH: 39.5 pg — ABNORMAL HIGH (ref 26.0–34.0)
MCHC: 34 g/dL (ref 32.0–36.0)
MCV: 116.3 fL — AB (ref 80.0–100.0)
MONO ABS: 0.2 10*3/uL (ref 0.2–1.0)
MONOS PCT: 2 %
NEUTROS ABS: 10.2 10*3/uL — AB (ref 1.4–6.5)
Neutrophils Relative %: 94 %
Platelets: 133 10*3/uL — ABNORMAL LOW (ref 150–440)
RBC: 2.56 MIL/uL — ABNORMAL LOW (ref 4.40–5.90)
RDW: 16.5 % — AB (ref 11.5–14.5)
WBC: 10.9 10*3/uL — ABNORMAL HIGH (ref 3.8–10.6)

## 2015-02-28 LAB — COMPREHENSIVE METABOLIC PANEL
ALT: 11 U/L — ABNORMAL LOW (ref 17–63)
ANION GAP: 7 (ref 5–15)
AST: 18 U/L (ref 15–41)
Albumin: 2.9 g/dL — ABNORMAL LOW (ref 3.5–5.0)
Alkaline Phosphatase: 50 U/L (ref 38–126)
BUN: 26 mg/dL — ABNORMAL HIGH (ref 6–20)
CHLORIDE: 109 mmol/L (ref 101–111)
CO2: 25 mmol/L (ref 22–32)
Calcium: 8.6 mg/dL — ABNORMAL LOW (ref 8.9–10.3)
Creatinine, Ser: 1.08 mg/dL (ref 0.61–1.24)
GFR, EST NON AFRICAN AMERICAN: 57 mL/min — AB (ref >60)
Glucose, Bld: 155 mg/dL — ABNORMAL HIGH (ref 65–99)
POTASSIUM: 3.8 mmol/L (ref 3.5–5.1)
Sodium: 141 mmol/L (ref 135–145)
Total Bilirubin: 1.7 mg/dL — ABNORMAL HIGH (ref 0.3–1.2)
Total Protein: 6.3 g/dL — ABNORMAL LOW (ref 6.5–8.1)

## 2015-02-28 LAB — URINALYSIS COMPLETE WITH MICROSCOPIC (ARMC ONLY)
Bilirubin Urine: NEGATIVE
Glucose, UA: NEGATIVE mg/dL
Ketones, ur: NEGATIVE mg/dL
LEUKOCYTES UA: NEGATIVE
NITRITE: NEGATIVE
PROTEIN: 100 mg/dL — AB
SPECIFIC GRAVITY, URINE: 1.021 (ref 1.005–1.030)
Squamous Epithelial / LPF: NONE SEEN
WBC UA: NONE SEEN WBC/hpf (ref 0–5)
pH: 5 (ref 5.0–8.0)

## 2015-02-28 LAB — LACTIC ACID, PLASMA
LACTIC ACID, VENOUS: 1.6 mmol/L (ref 0.5–2.0)
Lactic Acid, Venous: 3.2 mmol/L (ref 0.5–2.0)

## 2015-02-28 LAB — BLOOD GAS, VENOUS
ACID-BASE EXCESS: 2.6 mmol/L (ref 0.0–3.0)
Bicarbonate: 27.2 mEq/L (ref 21.0–28.0)
FIO2: 28
PH VEN: 7.43 (ref 7.320–7.430)
Patient temperature: 37
pCO2, Ven: 41 mmHg — ABNORMAL LOW (ref 44.0–60.0)

## 2015-02-28 LAB — GLUCOSE, CAPILLARY: GLUCOSE-CAPILLARY: 112 mg/dL — AB (ref 65–99)

## 2015-02-28 LAB — TROPONIN I

## 2015-02-28 LAB — MRSA PCR SCREENING: MRSA by PCR: NEGATIVE

## 2015-02-28 MED ORDER — INSULIN ASPART 100 UNIT/ML ~~LOC~~ SOLN: 0.0000 [IU] | Freq: Every day | SUBCUTANEOUS | Status: DC

## 2015-02-28 MED ORDER — LEVOFLOXACIN IN D5W 750 MG/150ML IV SOLN
750.0000 mg | Freq: Once | INTRAVENOUS | Status: AC
  Administered 2015-02-28: 750 mg via INTRAVENOUS
  Filled 2015-02-28: qty 150

## 2015-02-28 MED ORDER — ALBUTEROL SULFATE (2.5 MG/3ML) 0.083% IN NEBU: 2.5000 mg | INHALATION_SOLUTION | RESPIRATORY_TRACT | Status: DC | PRN

## 2015-02-28 MED ORDER — SODIUM CHLORIDE 0.9 % IV BOLUS (SEPSIS)
1000.0000 mL | INTRAVENOUS | Status: AC
  Administered 2015-02-28: 1000 mL via INTRAVENOUS

## 2015-02-28 MED ORDER — ACETAMINOPHEN 650 MG RE SUPP: 650.0000 mg | Freq: Four times a day (QID) | RECTAL | Status: DC | PRN

## 2015-02-28 MED ORDER — PIPERACILLIN-TAZOBACTAM 3.375 G IVPB
3.3750 g | Freq: Three times a day (TID) | INTRAVENOUS | Status: DC
  Administered 2015-02-28 – 2015-03-04 (×11): 3.375 g via INTRAVENOUS
  Filled 2015-02-28 (×14): qty 50

## 2015-02-28 MED ORDER — PIPERACILLIN-TAZOBACTAM 3.375 G IVPB 30 MIN
3.3750 g | Freq: Once | INTRAVENOUS | Status: AC
  Administered 2015-02-28: 3.375 g via INTRAVENOUS
  Filled 2015-02-28: qty 50

## 2015-02-28 MED ORDER — VANCOMYCIN HCL IN DEXTROSE 1-5 GM/200ML-% IV SOLN
1000.0000 mg | Freq: Once | INTRAVENOUS | Status: AC
  Administered 2015-03-01: 1000 mg via INTRAVENOUS
  Filled 2015-02-28: qty 200

## 2015-02-28 MED ORDER — VANCOMYCIN HCL IN DEXTROSE 1-5 GM/200ML-% IV SOLN
1000.0000 mg | INTRAVENOUS | Status: DC
  Filled 2015-02-28: qty 200

## 2015-02-28 MED ORDER — ACETAMINOPHEN 650 MG RE SUPP
650.0000 mg | Freq: Once | RECTAL | Status: AC
  Administered 2015-02-28: 650 mg via RECTAL

## 2015-02-28 MED ORDER — ACETAMINOPHEN 325 MG PO TABS
650.0000 mg | ORAL_TABLET | Freq: Four times a day (QID) | ORAL | Status: DC | PRN
  Administered 2015-03-01 – 2015-03-03 (×3): 650 mg via ORAL
  Filled 2015-02-28 (×3): qty 2

## 2015-02-28 MED ORDER — POTASSIUM CHLORIDE IN NACL 20-0.9 MEQ/L-% IV SOLN
INTRAVENOUS | Status: AC
  Administered 2015-02-28: 19:00:00 via INTRAVENOUS
  Filled 2015-02-28 (×2): qty 1000

## 2015-02-28 MED ORDER — ACETAMINOPHEN 650 MG RE SUPP
RECTAL | Status: AC
  Administered 2015-02-28: 650 mg via RECTAL
  Filled 2015-02-28: qty 1

## 2015-02-28 MED ORDER — ENOXAPARIN SODIUM 40 MG/0.4ML ~~LOC~~ SOLN
40.0000 mg | SUBCUTANEOUS | Status: DC
Start: 2015-02-28 — End: 2015-03-01
  Administered 2015-02-28: 40 mg via SUBCUTANEOUS
  Filled 2015-02-28: qty 0.4

## 2015-02-28 MED ORDER — IPRATROPIUM-ALBUTEROL 0.5-2.5 (3) MG/3ML IN SOLN
3.0000 mL | Freq: Four times a day (QID) | RESPIRATORY_TRACT | Status: DC
  Administered 2015-02-28 – 2015-03-02 (×7): 3 mL via RESPIRATORY_TRACT
  Filled 2015-02-28 (×8): qty 3

## 2015-02-28 MED ORDER — VANCOMYCIN HCL IN DEXTROSE 1-5 GM/200ML-% IV SOLN
1000.0000 mg | Freq: Once | INTRAVENOUS | Status: AC
  Administered 2015-02-28: 1000 mg via INTRAVENOUS
  Filled 2015-02-28: qty 200

## 2015-02-28 MED ORDER — INSULIN ASPART 100 UNIT/ML ~~LOC~~ SOLN
0.0000 [IU] | Freq: Three times a day (TID) | SUBCUTANEOUS | Status: DC
  Administered 2015-03-01 – 2015-03-02 (×3): 1 [IU] via SUBCUTANEOUS
  Filled 2015-02-28 (×3): qty 1

## 2015-02-28 MED ORDER — FLUTICASONE PROPIONATE 50 MCG/ACT NA SUSP
2.0000 | Freq: Every day | NASAL | Status: DC
  Administered 2015-02-28 – 2015-03-04 (×5): 2 via NASAL
  Filled 2015-02-28 (×2): qty 16

## 2015-02-28 NOTE — ED Provider Notes (Signed)
[facility] Emergency Department Provider Note     Time seen: ----------------------------------------- 11:17 AM on 02/28/2015 -----------------------------------------  L5 caveat: Review of systems and history is difficult to obtain due to dementia  I have reviewed the triage vital signs and the nursing notes.   HISTORY  Chief Complaint No chief complaint on file.    HPI Aaron Cook is a 81 y.o. male brought the ER by EMS from nursing home for fever and weakness. According to report he is brought in tachycardic with a normal blood pressure but elevated temperature. Concerns for sepsis, he lives in nursing home, is unable to give review of systems or report.   Past Medical History  Diagnosis Date  . Diabetes mellitus   . Incontinence   . Over weight   . BPH (benign prostatic hyperplasia)   . CHF (congestive heart failure)   . Pneumonia   . Lung mass   . CAD (coronary artery disease)   . A-fib   . HTN (hypertension)   . Anemia, chronic disease   . Hyperlipemia   . Hypothyroid   . Depression   . Prostate carcinoma   . Glaucoma     There are no active problems to display for this patient.   Past Surgical History  Procedure Laterality Date  . Penile prosthesis placement    . Penile prosthesis  removal    . Shoulder replacement    . Replacement total knee      Allergies Atropine and Scopolamine  Social History Social History  Substance Use Topics  . Smoking status: Former Research scientist (life sciences)  . Smokeless tobacco: Not on file  . Alcohol Use: No    Review of Systems Constitutional: Positive for fever  10-point ROS otherwise unknown  ____________________________________________   PHYSICAL EXAM:  VITAL SIGNS: ED Triage Vitals  Enc Vitals Group     BP --      Pulse --      Resp --      Temp --      Temp src --      SpO2 --      Weight --      Height --      Head Cir --      Peak Flow --      Pain Score --      Pain Loc  --      Pain Edu? --      Excl. in [place]? --     Constitutional: Alert and oriented. Generally ill-appearing, mild distress Eyes: Conjunctivae are normal. PERRL. Normal extraocular movements. ENT   Head: Normocephalic and atraumatic.   Nose: No congestion/rhinnorhea.   Mouth/Throat: Mucous membranes are moist.   Neck: No stridor. Cardiovascular: Rapid rate, regular rhythm. No murmurs, rubs, or gallops. Respiratory: Normal respiratory effort without tachypnea. Scattered rhonchi. Gastrointestinal: Soft and nontender. No distention. No abdominal bruits.  Musculoskeletal: Nontender with normal range of motion in all extremities. No joint effusions.  No lower extremity tenderness, bilateral edema is noted Neurologic:  No gross focal neurologic deficits are appreciated. Skin:  Skin is warm, dry and intact. No rash noted. ____________________________________________  EKG: Interpreted by me. Sinus tachycardia with a rate of 115 bpm, multiple PVCs, left axis deviation, no evidence of acute infarction, old anterior infarct  ____________________________________________  ED COURSE:  Pertinent labs & imaging results that were available during my care of the patient were reviewed by me and considered in my medical decision making (see chart for details).  Patient will be code sepsis protocol, received fluids, IV antibiotics cultures and lactic acid levels. ____________________________________________    LABS (pertinent positives/negatives)  Labs Reviewed  CBC WITH DIFFERENTIAL/PLATELET - Abnormal; Notable for the following:    WBC 10.9 (*)    RBC 2.56 (*)    Hemoglobin 10.1 (*)    HCT 29.8 (*)    MCV 116.3 (*)    MCH 39.5 (*)    RDW 16.5 (*)    Platelets 133 (*)    Neutro Abs 10.2 (*)    Lymphs Abs 0.4 (*)    All other components within normal limits  BLOOD GAS, VENOUS - Abnormal; Notable for the following:    pCO2, Ven 41 (*)    All other components within normal limits   URINALYSIS COMPLETEWITH MICROSCOPIC ([facility] ONLY) - Abnormal; Notable for the following:    Color, Urine AMBER (*)    APPearance HAZY (*)    Hgb urine dipstick 1+ (*)    Protein, ur 100 (*)    Bacteria, UA RARE (*)    All other components within normal limits  CULTURE, BLOOD (ROUTINE X 2)  CULTURE, BLOOD (ROUTINE X 2)  URINE CULTURE  LACTIC ACID, PLASMA  LACTIC ACID, PLASMA  LACTIC ACID, PLASMA  COMPREHENSIVE METABOLIC PANEL  TROPONIN I    RADIOLOGY Images were viewed by me  Chest x-ray  IMPRESSION: Areas of airspace consolidation bilaterally, more on the left than on the right, likely pneumonia, although atypical pulmonary edema could present similarly. Lungs elsewhere clear. Cardiac silhouette within normal limits and stable. Atherosclerotic calcification noted. ____________________________________________  FINAL ASSESSMENT AND PLAN  Healthcare associated pneumonia  Plan: Patient with labs and imaging as dictated above. Patient was given broad-spectrum IV antibiotics, had cultures obtained. Patient currently has stable vital signs, would benefit from inpatient hospitalization and admission to ensure improvement.   Earleen Newport, MD   Earleen Newport, MD 02/28/15 [phone]

## 2015-02-28 NOTE — ED Notes (Signed)
[place] resident noted febrile and weak this am.

## 2015-02-28 NOTE — ED Notes (Signed)
Consulted MD re lactic acid results and VS, limit NS to 1051ml total bolus per instructions.

## 2015-02-28 NOTE — Progress Notes (Signed)
ANTICOAGULATION CONSULT NOTE - Initial Consult  Pharmacy Consult for Lovenox Indication: VTE prophylaxis  Allergies  Allergen Reactions  . Atropine Other (See Comments)    Reaction: unknown  . Scopolamine Other (See Comments)    Reaction: unknown    Patient Measurements: Height: 5\' 4"  (162.6 cm) Weight: 170 lb (77.111 kg) IBW/kg (Calculated) : 59.2 Heparin Dosing Weight:   Vital Signs: Temp: 99.8 F (37.7 C) (11/07 1813) Temp Source: Oral (11/07 1813) BP: 106/53 mmHg (11/07 1813) Pulse Rate: 124 (11/07 1813)  Labs:  Recent Labs  02/28/15 1116 02/28/15 1227  HGB 10.1*  --   HCT 29.8*  --   PLT 133*  --   CREATININE  --  1.08  TROPONINI  --  <0.03    Estimated Creatinine Clearance: 41 mL/min (by C-G formula based on Cr of 1.08).   Medical History: Past Medical History  Diagnosis Date  . Diabetes mellitus ([place])   . Incontinence   . Over weight   . BPH (benign prostatic hyperplasia)   . CHF (congestive heart failure) ([place])   . Pneumonia   . Lung mass   . CAD (coronary artery disease)   . A-fib ([place])   . HTN (hypertension)   . Anemia, chronic disease   . Hyperlipemia   . Hypothyroid   . Depression   . Prostate carcinoma ([place])   . Glaucoma     Medications:  Prescriptions prior to admission  Medication Sig Dispense Refill Last Dose  . acetaminophen (TYLENOL) 325 MG tablet Take 650 mg by mouth every 4 (four) hours as needed for moderate pain or fever.    02/27/2015 at 2000  . alendronate (FOSAMAX) 70 MG tablet Take 70 mg by mouth once a week. Take with a full glass of water on an empty stomach.   02/26/2015 at Unknown time  . ALPRAZolam (NIRAVAM) 0.25 MG dissolvable tablet Take 0.25 mg by mouth every 6 (six) hours as needed for anxiety.   02/27/2015 at Unknown time  . alum & mag hydroxide-simeth (MYLANTA) 026-378-58 MG/5ML suspension Take 30 mLs by mouth every 4 (four) hours as needed for indigestion (upset stomach).   prn at prn  . brimonidine (ALPHAGAN)  0.2 % ophthalmic solution Place 1 drop into the right eye 2 (two) times daily.    02/28/2015 at 0800  . Calcium-Magnesium-Vitamin D (CALCIUM 500 PO) Take 1 tablet by mouth daily.   02/28/2015 at 0800  . Cholecalciferol 5000 UNITS capsule Take 5,000 Units by mouth once a week.   Past Month at Unknown time  . docusate sodium (COLACE) 100 MG capsule Take 100 mg by mouth 2 (two) times daily as needed for mild constipation.    prn at prn  . Ergocalciferol 2000 UNITS TABS Take 1 tablet by mouth daily.   02/28/2015 at 0900  . fentaNYL (DURAGESIC - DOSED MCG/HR) 12 MCG/HR Place 1 patch onto the skin every 3 (three) days.   02/26/2015 at 1312  . ferrous sulfate 325 (65 FE) MG tablet Take 325 mg by mouth daily with breakfast.   02/28/2015 at 0800  . finasteride (PROSCAR) 5 MG tablet Take 5 mg by mouth daily.   02/28/2015 at 0800  . fluticasone (FLONASE) 50 MCG/ACT nasal spray Place 2 sprays into both nostrils daily.    02/28/2015 at 0800  . folic acid (FOLVITE) 1 MG tablet Take 3 mg by mouth daily.    02/28/2015 at 0800  . furosemide (LASIX) 20 MG tablet Take 20 mg by mouth.  02/27/2015 at 0800  . guaiFENesin (ROBITUSSIN) 100 MG/5ML liquid Take 200 mg by mouth every 4 (four) hours as needed for cough.   prn at prn  . HYDROcodone-acetaminophen (NORCO/VICODIN) 5-325 MG tablet Take 1 tablet by mouth every 12 (twelve) hours as needed for moderate pain.   02/27/2015 at Unknown time  . hydroxypropyl methylcellulose / hypromellose (ISOPTO TEARS / GONIOVISC) 2.5 % ophthalmic solution Place 1 drop into both eyes 2 (two) times daily as needed for dry eyes (itchy eyes).   prn at prn  . ipratropium-albuterol (DUONEB) 0.5-2.5 (3) MG/3ML SOLN Take 3 mLs by nebulization every 4 (four) hours as needed (cough/ wheezing).   prn at prn  . latanoprost (XALATAN) 0.005 % ophthalmic solution Place 1 drop into the right eye at bedtime.    02/27/2015 at 2100  . levothyroxine (SYNTHROID, LEVOTHROID) 137 MCG tablet Take 137 mcg by mouth daily  before breakfast.   02/28/2015 at 0800  . magnesium hydroxide (MILK OF MAGNESIA) 400 MG/5ML suspension Take 30 mLs by mouth daily as needed for mild constipation.    prn at prn  . methotrexate (RHEUMATREX) 2.5 MG tablet Take 10 mg by mouth once a week. Caution:Chemotherapy. Protect from light.   02/26/2015 at 0800  . montelukast (SINGULAIR) 10 MG tablet Take 10 mg by mouth daily.    02/28/2015 at 0800  . omeprazole (PRILOSEC) 20 MG capsule Take 20 mg by mouth daily.   02/28/2015 at 0800  . PARoxetine (PAXIL) 20 MG tablet Take 20 mg by mouth daily.   02/28/2015 at 0800  . potassium chloride (K-DUR) 10 MEQ tablet Take 1 tablet by mouth daily.   02/28/2015 at 0800  . predniSONE (DELTASONE) 5 MG tablet Take 15 tablets by mouth daily with breakfast.    02/28/2015 at 0800  . Propylene Glycol (SYSTANE BALANCE) 0.6 % SOLN Place 1 drop into the left eye 2 (two) times daily.    02/28/2015 at 0800  . vitamin B-12 (CYANOCOBALAMIN) 500 MCG tablet Take 1,000 mcg by mouth daily.    02/28/2015 at 0900    Assessment: CrCl = 41 ml/min  Goal of Therapy:  DVT prophylaxis   Plan:  Lovenox 30 mg SQ Q24H originally ordered.  Will adjust dose to lovenox 40 mg SQ Q24H based on CrCl > 30 ml/min.   Ollivander See D 02/28/2015,6:28 PM

## 2015-02-28 NOTE — Progress Notes (Addendum)
ANTIBIOTIC CONSULT NOTE - INITIAL  Pharmacy Consult for VANCOMYCIN/ ZOSYN Indication: pneumonia/?HCAP  Allergies  Allergen Reactions  . Atropine Other (See Comments)    Reaction: unknown  . Scopolamine Other (See Comments)    Reaction: unknown    Patient Measurements: Height: 5\' 4"  (162.6 cm) Weight: 170 lb (77.111 kg) IBW/kg (Calculated) : 59.2 Adjusted Body Weight: 66.4 kg  Vital Signs: Temp: 99.6 F (37.6 C) (11/07 1241) Temp Source: Core (Comment) (11/07 1241) BP: 103/57 mmHg (11/07 1300) Pulse Rate: 99 (11/07 1300) Intake/Output from previous day:   Intake/Output from this shift: Total I/O In: 1000 [IV Piggyback:1000] Out: -   Labs:  Recent Labs  02/28/15 1116 02/28/15 1227  WBC 10.9*  --   HGB 10.1*  --   PLT 133*  --   CREATININE  --  1.08   Estimated Creatinine Clearance: 41 mL/min (by C-G formula based on Cr of 1.08).   Microbiology: No results found for this or any previous visit (from the past 720 hour(s)).  Medical History: Past Medical History  Diagnosis Date  . Diabetes mellitus ([place])   . Incontinence   . Over weight   . BPH (benign prostatic hyperplasia)   . CHF (congestive heart failure) ([place])   . Pneumonia   . Lung mass   . CAD (coronary artery disease)   . A-fib ([place])   . HTN (hypertension)   . Anemia, chronic disease   . Hyperlipemia   . Hypothyroid   . Depression   . Prostate carcinoma ([place])   . Glaucoma     Medications:  Scheduled:   Anti-infectives    Start     Dose/Rate Route Frequency Ordered Stop   03/02/15 0100  vancomycin (VANCOCIN) IVPB 1000 mg/200 mL premix     1,000 mg 200 mL/hr over 60 Minutes Intravenous Every 24 hours 02/28/15 1433     03/01/15 0100  vancomycin (VANCOCIN) IVPB 1000 mg/200 mL premix     1,000 mg 200 mL/hr over 60 Minutes Intravenous  Once 02/28/15 1433     02/28/15 2000  piperacillin-tazobactam (ZOSYN) IVPB 3.375 g     3.375 g 12.5 mL/hr over 240 Minutes Intravenous Every 8 hours  02/28/15 1415     02/28/15 1400  levofloxacin (LEVAQUIN) IVPB 750 mg     750 mg 100 mL/hr over 90 Minutes Intravenous  Once 02/28/15 1345     02/28/15 1115  piperacillin-tazobactam (ZOSYN) IVPB 3.375 g     3.375 g 100 mL/hr over 30 Minutes Intravenous  Once 02/28/15 1110 02/28/15 1159   02/28/15 1115  vancomycin (VANCOCIN) IVPB 1000 mg/200 mL premix     1,000 mg 200 mL/hr over 60 Minutes Intravenous  Once 02/28/15 1110 02/28/15 1316     Assessment: 81 y.o. male brought the ER by EMS from nursing home for fever and weakness, possible sepsis, CXR= consolidation.,?HCAP.  Hx dementia, DM, CHF. Ke 0.035  T1/2 19.8  Vd 46.48  Goal of Therapy:  Vancomycin trough level 15-20 mcg/ml  Plan:  Measure antibiotic drug levels at steady state Follow up culture results  Patient received Vancomycin 1 gram x1 in ER and Zosyn 3.375gm IV x 1 in ER. Will continue with EI Zosyn 3.375gm IV q8h. Will give 2nd dose of Vancomycin ~ 12 hrs after 1st dose for stacked dosing. Will then continue with Vancomycin 1 gram IV Q24h. Will order Vancomycin trough on 11/10 at [facility] [place]. Patient also has order for Levaquin 750mg  IV x 1 in ER.  Chinita Greenland  PharmD Clinical Pharmacist 02/28/2015 2:42 PM

## 2015-02-28 NOTE — H&P (Signed)
[facility] NAME: Aaron Cook    MR#:  [id number]  DATE OF BIRTH:  1920/06/23  DATE OF ADMISSION:  02/28/2015  PRIMARY CARE PHYSICIAN: No primary care provider on file.   REQUESTING/REFERRING PHYSICIAN: Dr. Jimmye Norman  CHIEF COMPLAINT:   Fever HISTORY OF PRESENT ILLNESS:  Aaron Cook  is a 81 y.o. male with a known history of congestive heart failure, lung mass, hyperlipidemia, chronic atrial fibrillation and essential hypertension and multiple other medical problems is brought into the ED from [facility], nursing home for fever and generalized weakness. The patient was found to be tachycardic but with a normal blood pressure at the time of arrival. Chest x-ray has revealed bilateral pneumonia. Patient is with altered mental status and unable to get any history from him. Daughter is at bedside and has provided the history. She has reported that patient was diagnosed with lung mass last year but they have decided not to pursue or investigate further given his elderly age  PAST MEDICAL HISTORY:   Past Medical History  Diagnosis Date  . Diabetes mellitus ([place])   . Incontinence   . Over weight   . BPH (benign prostatic hyperplasia)   . CHF (congestive heart failure) ([place])   . Pneumonia   . Lung mass   . CAD (coronary artery disease)   . A-fib ([place])   . HTN (hypertension)   . Anemia, chronic disease   . Hyperlipemia   . Hypothyroid   . Depression   . Prostate carcinoma ([place])   . Glaucoma     PAST SURGICAL HISTOIRY:   Past Surgical History  Procedure Laterality Date  . Penile prosthesis placement    . Penile prosthesis  removal    . Shoulder replacement    . Replacement total knee      SOCIAL HISTORY:   Social History  Substance Use Topics  . Smoking status: Former Research scientist (life sciences)  . Smokeless tobacco: Not on file  . Alcohol Use: No    FAMILY HISTORY:   Family History  Problem Relation Age of Onset   . Heart disease Brother   . Heart disease Father     DRUG ALLERGIES:   Allergies  Allergen Reactions  . Atropine Other (See Comments)    Reaction: unknown  . Scopolamine Other (See Comments)    Reaction: unknown    REVIEW OF SYSTEMS:  Review of systems is unobtainable as the patient is with altered mental status  MEDICATIONS AT HOME:   Prior to Admission medications   Medication Sig Start Date End Date Taking? Authorizing Provider  acetaminophen (TYLENOL) 325 MG tablet Take 650 mg by mouth every 4 (four) hours as needed for moderate pain or fever.    Yes Historical Provider, MD  alendronate (FOSAMAX) 70 MG tablet Take 70 mg by mouth once a week. Take with a full glass of water on an empty stomach.   Yes Historical Provider, MD  ALPRAZolam (NIRAVAM) 0.25 MG dissolvable tablet Take 0.25 mg by mouth every 6 (six) hours as needed for anxiety.   Yes Historical Provider, MD  alum & mag hydroxide-simeth (MYLANTA) 200-200-20 MG/5ML suspension Take 30 mLs by mouth every 4 (four) hours as needed for indigestion (upset stomach).   Yes Historical Provider, MD  brimonidine (ALPHAGAN) 0.2 % ophthalmic solution Place 1 drop into the right eye 2 (two) times daily.    Yes Historical Provider, MD  Calcium-Magnesium-Vitamin D (CALCIUM 500 PO) Take 1 tablet  by mouth daily.   Yes Historical Provider, MD  Cholecalciferol 5000 UNITS capsule Take 5,000 Units by mouth once a week.   Yes Historical Provider, MD  docusate sodium (COLACE) 100 MG capsule Take 100 mg by mouth 2 (two) times daily as needed for mild constipation.    Yes Historical Provider, MD  Ergocalciferol 2000 UNITS TABS Take 1 tablet by mouth daily.   Yes Historical Provider, MD  fentaNYL (DURAGESIC - DOSED MCG/HR) 12 MCG/HR Place 1 patch onto the skin every 3 (three) days. 11/15/14  Yes Historical Provider, MD  ferrous sulfate 325 (65 FE) MG tablet Take 325 mg by mouth daily with breakfast.   Yes Historical Provider, MD  finasteride (PROSCAR)  5 MG tablet Take 5 mg by mouth daily.   Yes Historical Provider, MD  fluticasone (FLONASE) 50 MCG/ACT nasal spray Place 2 sprays into both nostrils daily.    Yes Historical Provider, MD  folic acid (FOLVITE) 1 MG tablet Take 3 mg by mouth daily.    Yes Historical Provider, MD  furosemide (LASIX) 20 MG tablet Take 20 mg by mouth.   Yes Historical Provider, MD  guaiFENesin (ROBITUSSIN) 100 MG/5ML liquid Take 200 mg by mouth every 4 (four) hours as needed for cough.   Yes Historical Provider, MD  HYDROcodone-acetaminophen (NORCO/VICODIN) 5-325 MG tablet Take 1 tablet by mouth every 12 (twelve) hours as needed for moderate pain.   Yes Historical Provider, MD  hydroxypropyl methylcellulose / hypromellose (ISOPTO TEARS / GONIOVISC) 2.5 % ophthalmic solution Place 1 drop into both eyes 2 (two) times daily as needed for dry eyes (itchy eyes).   Yes Historical Provider, MD  ipratropium-albuterol (DUONEB) 0.5-2.5 (3) MG/3ML SOLN Take 3 mLs by nebulization every 4 (four) hours as needed (cough/ wheezing).   Yes Historical Provider, MD  latanoprost (XALATAN) 0.005 % ophthalmic solution Place 1 drop into the right eye at bedtime.    Yes Historical Provider, MD  levothyroxine (SYNTHROID, LEVOTHROID) 137 MCG tablet Take 137 mcg by mouth daily before breakfast.   Yes Historical Provider, MD  magnesium hydroxide (MILK OF MAGNESIA) 400 MG/5ML suspension Take 30 mLs by mouth daily as needed for mild constipation.    Yes Historical Provider, MD  methotrexate (RHEUMATREX) 2.5 MG tablet Take 10 mg by mouth once a week. Caution:Chemotherapy. Protect from light.   Yes Historical Provider, MD  montelukast (SINGULAIR) 10 MG tablet Take 10 mg by mouth daily.    Yes Historical Provider, MD  omeprazole (PRILOSEC) 20 MG capsule Take 20 mg by mouth daily.   Yes Historical Provider, MD  PARoxetine (PAXIL) 20 MG tablet Take 20 mg by mouth daily.   Yes Historical Provider, MD  potassium chloride (K-DUR) 10 MEQ tablet Take 1 tablet by  mouth daily. 12/06/14  Yes Historical Provider, MD  predniSONE (DELTASONE) 5 MG tablet Take 15 tablets by mouth daily with breakfast.  12/06/14  Yes Historical Provider, MD  Propylene Glycol (SYSTANE BALANCE) 0.6 % SOLN Place 1 drop into the left eye 2 (two) times daily.    Yes Historical Provider, MD  vitamin B-12 (CYANOCOBALAMIN) 500 MCG tablet Take 1,000 mcg by mouth daily.    Yes Historical Provider, MD      VITAL SIGNS:  Blood pressure 103/57, pulse 99, temperature 99.6 F (37.6 C), temperature source Core (Comment), resp. rate 18, height 5\' 4"  (1.626 m), weight 77.111 kg (170 lb), SpO2 98 %.  PHYSICAL EXAMINATION:  GENERAL:  81 y.o.-year-old patient lying in the bed with no acute  distress. Very lethargic EYES: Pupils equal, round, reactive to light and accommodation. No scleral icterus.  HEENT: Head atraumatic, normocephalic. Oropharynx and nasopharynx clear.  NECK:  Supple, no jugular venous distention. No thyroid enlargement, no tenderness.  LUNGS: Coarse bronchial breath sounds, with moderate air entry bilaterally, no wheezing, positive rales and crepitation. No use of accessory muscles of respiration.  CARDIOVASCULAR: S1, S2 normal. No murmurs, rubs, or gallops.  ABDOMEN: Soft, nontender, nondistended. Bowel sounds present. No organomegaly or mass.  EXTREMITIES: No pedal edema, cyanosis, or clubbing.  NEUROLOGIC: Patient is a very lethargic PSYCHIATRIC: The patient is disoriented  SKIN: Fragile with multiple bruises and lacerations on the all  4 extremities   LABORATORY PANEL:   CBC  Recent Labs Lab 02/28/15 1116  WBC 10.9*  HGB 10.1*  HCT 29.8*  PLT 133*   ------------------------------------------------------------------------------------------------------------------  Chemistries   Recent Labs Lab 02/28/15 1227  NA 141  K 3.8  CL 109  CO2 25  GLUCOSE 155*  BUN 26*  CREATININE 1.08  CALCIUM 8.6*  AST 18  ALT 11*  ALKPHOS 50  BILITOT 1.7*    ------------------------------------------------------------------------------------------------------------------  Cardiac Enzymes  Recent Labs Lab 02/28/15 1227  TROPONINI <0.03   ------------------------------------------------------------------------------------------------------------------  RADIOLOGY:  Dg Chest Port 1 View  02/28/2015  CLINICAL DATA:  Fever and weakness; hypoxia EXAM: PORTABLE CHEST 1 VIEW COMPARISON:  January 21, 2015 FINDINGS: There is patchy airspace consolidation in both lower lobes, more on the left than on the right. Lungs elsewhere are clear. The heart is upper normal in size with pulmonary vascularity within normal limits. There is atherosclerotic calcification in the aorta. Patient is status post bilateral total shoulder replacements. IMPRESSION: Areas of airspace consolidation bilaterally, more on the left than on the right, likely pneumonia, although atypical pulmonary edema could present similarly. Lungs elsewhere clear. Cardiac silhouette within normal limits and stable. Atherosclerotic calcification noted. Electronically Signed   By: Lowella Grip III M.D.   On: 02/28/2015 12:24    EKG:   Orders placed or performed during the hospital encounter of 02/28/15  . EKG 12-Lead  . EKG 12-Lead  . ED EKG 12-Lead  . ED EKG 12-Lead    IMPRESSION AND PLAN:   Aaron Cook  is a 81 y.o. male with a known history of congestive heart failure, lung mass, hyperlipidemia, chronic atrial fibrillation and essential hypertension and multiple other medical problems is brought into the ED from [facility], nursing home for fever and generalized weakness. The patient was found to be tachycardic but with a normal blood pressure at the time of arrival. Chest x-ray has revealed bilateral pneumonia  1. Altered mental status secondary to sepsis from bilateral pneumonia Septic protocol was initiated in the ED. Blood cultures and urine cultures were  obtained.  2. Sepsis secondary to bilateral pneumonia-patient meets septic criteria with the fever, tachycardia and leukocytosis We will treat him with Zosyn and vancomycin for healthcare associated pneumonia as patient is from nursing home.   sputum culture and sensitivity is ordered. Will provide him nebulizer treatments as needed for shortness of breath.  3. History of lung mass which was diagnosed during last year Family has decided not to pursue further  4. Chronic atrial fibrillation currently rate controlled. Currently patient is nothing by mouth as his altered mentation Will provide Cardizem IV boluses as needed basis for RVR  5. Chronic history of congestive heart failure not fluid overloaded at this time Will provide gentle hydration with 1 L of IV fluids as  patient is septic Will closely monitor the patient for symptoms and signs of fluid overload. Currently keeping him nothing by mouth for altered mental status. We will monitor daily weights and check intake and output     All the records are reviewed and case discussed with ED provider. Management plans discussed with the patient, family and they are in agreement.  CODE STATUS: DO NOT RESUSCITATE, all children are healthcare power of attorney  TOTAL TIME TAKING CARE OF THIS PATIENT: 45 minutes.  Greater than 50% time was spent in [place] of care and face-to-face counseling   Nicholes Mango M.D on 02/28/2015 at 3:17 PM  Between 7am to 6pm - Pager - [phone]  After 6pm go to [email or web address] - password [email or web address] [facility]  [facility]  Office  [phone]  CC: Primary care physician; No primary care provider on file.

## 2015-03-01 DIAGNOSIS — L8990 Pressure ulcer of unspecified site, unspecified stage: Secondary | ICD-10-CM | POA: Insufficient documentation

## 2015-03-01 LAB — CBC
HEMATOCRIT: 27.4 % — AB (ref 40.0–52.0)
HEMOGLOBIN: 9 g/dL — AB (ref 13.0–18.0)
MCH: 38.3 pg — ABNORMAL HIGH (ref 26.0–34.0)
MCHC: 32.8 g/dL (ref 32.0–36.0)
MCV: 116.7 fL — ABNORMAL HIGH (ref 80.0–100.0)
Platelets: 124 10*3/uL — ABNORMAL LOW (ref 150–440)
RBC: 2.35 MIL/uL — AB (ref 4.40–5.90)
RDW: 16.8 % — ABNORMAL HIGH (ref 11.5–14.5)
WBC: 12.3 10*3/uL — ABNORMAL HIGH (ref 3.8–10.6)

## 2015-03-01 LAB — COMPREHENSIVE METABOLIC PANEL
ALBUMIN: 2.7 g/dL — AB (ref 3.5–5.0)
ALK PHOS: 59 U/L (ref 38–126)
ALT: 13 U/L — ABNORMAL LOW (ref 17–63)
ANION GAP: 11 (ref 5–15)
AST: 34 U/L (ref 15–41)
BUN: 33 mg/dL — ABNORMAL HIGH (ref 6–20)
CALCIUM: 8.3 mg/dL — AB (ref 8.9–10.3)
CHLORIDE: 109 mmol/L (ref 101–111)
CO2: 22 mmol/L (ref 22–32)
Creatinine, Ser: 1.56 mg/dL — ABNORMAL HIGH (ref 0.61–1.24)
GFR calc non Af Amer: 37 mL/min — ABNORMAL LOW (ref >60)
GFR, EST AFRICAN AMERICAN: 43 mL/min — AB (ref >60)
GLUCOSE: 147 mg/dL — AB (ref 65–99)
POTASSIUM: 3.9 mmol/L (ref 3.5–5.1)
SODIUM: 142 mmol/L (ref 135–145)
Total Bilirubin: 1.3 mg/dL — ABNORMAL HIGH (ref 0.3–1.2)
Total Protein: 6.2 g/dL — ABNORMAL LOW (ref 6.5–8.1)

## 2015-03-01 LAB — HEMOGLOBIN A1C: HEMOGLOBIN A1C: 4.9 % (ref 4.0–6.0)

## 2015-03-01 LAB — GLUCOSE, CAPILLARY
GLUCOSE-CAPILLARY: 134 mg/dL — AB (ref 65–99)
GLUCOSE-CAPILLARY: 115 mg/dL — AB (ref 65–99)
Glucose-Capillary: 149 mg/dL — ABNORMAL HIGH (ref 65–99)

## 2015-03-01 MED ORDER — ENOXAPARIN SODIUM 30 MG/0.3ML ~~LOC~~ SOLN
30.0000 mg | SUBCUTANEOUS | Status: DC
  Administered 2015-03-01: 30 mg via SUBCUTANEOUS
  Filled 2015-03-01: qty 0.3

## 2015-03-01 MED ORDER — SODIUM CHLORIDE 0.9 % IV SOLN
INTRAVENOUS | Status: DC
  Administered 2015-03-01: 1000 mL via INTRAVENOUS
  Administered 2015-03-02: 05:00:00 via INTRAVENOUS

## 2015-03-01 MED ORDER — SODIUM CHLORIDE 0.9 % IV BOLUS (SEPSIS)
500.0000 mL | Freq: Once | INTRAVENOUS | Status: AC
  Administered 2015-03-01: 500 mL via INTRAVENOUS

## 2015-03-01 MED ORDER — VANCOMYCIN HCL IN DEXTROSE 1-5 GM/200ML-% IV SOLN
1000.0000 mg | INTRAVENOUS | Status: DC
  Filled 2015-03-01: qty 200

## 2015-03-01 NOTE — Consult Note (Signed)
WOC wound consult note Reason for Consult: skin tears on all extremities: right medial LE and left UE with steri-strips in place. Left lateral knee is an avulsion with 60% of skin missing. Right UE is nearly reeipthelialized. Wound type:trauma (Skin tears) Pressure Ulcer POA: No Measurement: Wound bed: Drainage (amount, consistency, odor)  Periwound: Dressing procedure/placement/frequency: Bilateral pressure redistribution heel boots are provided for pressure injury prevention as is guidance for the placement of a prophylactic sacral dressing and a pressure redistribution chair cushion for the chair when OOB. Conservative wound care orders are provided to the 4 tears  (one on each extremity) consisting of a non-adherent dressing topped with an atraumatic securement method.  Wound care is to be provided twice daily to avoid drying and adherence. [place] nursing team will not follow, but will remain available to this patient, the nursing and medical teams.  Please re-consult if needed. Thanks, Maudie Flakes, MSN, RN, [place], Arther Abbott  Pager# [phone]

## 2015-03-01 NOTE — NC FL2 (Signed)
[place] LEVEL OF CARE SCREENING TOOL     IDENTIFICATION  Patient Name: Aaron Cook Birthdate: 27-Jun-1920 Sex: male Admission Date (Current Location): 02/28/2015  [facility] and [place] Number:     Facility and Address:  [facility], [address], [place], [place] [address]      Provider Number: [id number]  Attending Physician Name and Address:  Demetrios Loll, MD  Relative Name and Phone Number:       Current Level of Care: Hospital Recommended Level of Care: [place] Prior Approval Number:    Date Approved/Denied:   PASRR Number:    Discharge Plan: SNF    Current Diagnoses: Patient Active Problem List   Diagnosis Date Noted  . HCAP (healthcare-associated pneumonia) 02/28/2015    Orientation ACTIVITIES/SOCIAL BLADDER RESPIRATION    Self  Active Incontinent  (5 liters O2 continuous)  BEHAVIORAL SYMPTOMS/MOOD NEUROLOGICAL BOWEL NUTRITION STATUS   (none)  (none) Continent Diet  PHYSICIAN VISITS COMMUNICATION OF NEEDS Height & Weight Skin  30 days Verbally   174 lbs. Normal          AMBULATORY STATUS RESPIRATION    Assist extensive  (5 liters O2 continuous)      Personal Care Assistance Level of Assistance  Bathing, Feeding, Dressing Bathing Assistance: Maximum assistance Feeding assistance: Maximum assistance Dressing Assistance: Maximum assistance      Functional Limitations Info  Hearing   Hearing Info: Impaired         SPECIAL CARE FACTORS FREQUENCY  PT (By licensed PT)                   Additional Factors Info  Code Status, Allergies Code Status Info: DNR Allergies Info: atropine, scopolamine           Current Medications (03/01/2015): Current Facility-Administered Medications  Medication Dose Route Frequency Provider Last Rate Last Dose  . 0.9 %  sodium chloride infusion   Intravenous Continuous Demetrios Loll, MD      . acetaminophen (TYLENOL) tablet 650 mg  650 mg Oral Q6H  PRN Nicholes Mango, MD       Or  . acetaminophen (TYLENOL) suppository 650 mg  650 mg Rectal Q6H PRN Aruna Gouru, MD      . albuterol (PROVENTIL) (2.5 MG/3ML) 0.083% nebulizer solution 2.5 mg  2.5 mg Nebulization Q4H PRN Aruna Gouru, MD      . enoxaparin (LOVENOX) injection 30 mg  30 mg Subcutaneous Q24H Crystal G Scarpena, RPH      . fluticasone (FLONASE) 50 MCG/ACT nasal spray 2 spray  2 spray Each Nare Daily Nicholes Mango, MD   2 spray at 03/01/15 0941  . insulin aspart (novoLOG) injection 0-5 Units  0-5 Units Subcutaneous QHS Nicholes Mango, MD   0 Units at 02/28/15 2121  . insulin aspart (novoLOG) injection 0-9 Units  0-9 Units Subcutaneous TID WC Nicholes Mango, MD   1 Units at 03/01/15 1211  . ipratropium-albuterol (DUONEB) 0.5-2.5 (3) MG/3ML nebulizer solution 3 mL  3 mL Nebulization Q6H Nicholes Mango, MD   3 mL at 03/01/15 1403  . piperacillin-tazobactam (ZOSYN) IVPB 3.375 g  3.375 g Intravenous Q8H Fritzi Mandes, MD   3.375 g at 03/01/15 1212  . [START ON 03/02/2015] vancomycin (VANCOCIN) IVPB 1000 mg/200 mL premix  1,000 mg Intravenous Q36H Crystal G Scarpena, RPH       Do not use this list as official medication orders. Please verify with discharge summary.  Discharge Medications:   Medication  List    ASK your doctor about these medications        acetaminophen 325 MG tablet  Commonly known as:  TYLENOL  Take 650 mg by mouth every 4 (four) hours as needed for moderate pain or fever.     alendronate 70 MG tablet  Commonly known as:  FOSAMAX  Take 70 mg by mouth once a week. Take with a full glass of water on an empty stomach.     ALPRAZolam 0.25 MG dissolvable tablet  Commonly known as:  NIRAVAM  Take 0.25 mg by mouth every 6 (six) hours as needed for anxiety.     brimonidine 0.2 % ophthalmic solution  Commonly known as:  ALPHAGAN  Place 1 drop into the right eye 2 (two) times daily.     CALCIUM 500 PO  Take 1 tablet by mouth daily.     Cholecalciferol 5000 UNITS capsule  Take 5,000  Units by mouth once a week.     docusate sodium 100 MG capsule  Commonly known as:  COLACE  Take 100 mg by mouth 2 (two) times daily as needed for mild constipation.     Ergocalciferol 2000 UNITS Tabs  Take 1 tablet by mouth daily.     fentaNYL 12 MCG/HR  Commonly known as:  DURAGESIC - dosed mcg/hr  Place 1 patch onto the skin every 3 (three) days.     ferrous sulfate 325 (65 FE) MG tablet  Take 325 mg by mouth daily with breakfast.     finasteride 5 MG tablet  Commonly known as:  PROSCAR  Take 5 mg by mouth daily.     fluticasone 50 MCG/ACT nasal spray  Commonly known as:  FLONASE  Place 2 sprays into both nostrils daily.     folic acid 1 MG tablet  Commonly known as:  FOLVITE  Take 3 mg by mouth daily.     furosemide 20 MG tablet  Commonly known as:  LASIX  Take 20 mg by mouth.     guaiFENesin 100 MG/5ML liquid  Commonly known as:  ROBITUSSIN  Take 200 mg by mouth every 4 (four) hours as needed for cough.     HYDROcodone-acetaminophen 5-325 MG tablet  Commonly known as:  NORCO/VICODIN  Take 1 tablet by mouth every 12 (twelve) hours as needed for moderate pain.     hydroxypropyl methylcellulose / hypromellose 2.5 % ophthalmic solution  Commonly known as:  ISOPTO TEARS / GONIOVISC  Place 1 drop into both eyes 2 (two) times daily as needed for dry eyes (itchy eyes).     ipratropium-albuterol 0.5-2.5 (3) MG/3ML Soln  Commonly known as:  DUONEB  Take 3 mLs by nebulization every 4 (four) hours as needed (cough/ wheezing).     latanoprost 0.005 % ophthalmic solution  Commonly known as:  XALATAN  Place 1 drop into the right eye at bedtime.     levothyroxine 137 MCG tablet  Commonly known as:  SYNTHROID, LEVOTHROID  Take 137 mcg by mouth daily before breakfast.     magnesium hydroxide 400 MG/5ML suspension  Commonly known as:  MILK OF MAGNESIA  Take 30 mLs by mouth daily as needed for mild constipation.     methotrexate 2.5 MG tablet  Commonly known as:   RHEUMATREX  Take 10 mg by mouth once a week. Caution:Chemotherapy. Protect from light.     montelukast 10 MG tablet  Commonly known as:  SINGULAIR  Take 10 mg by mouth daily.  MYLANTA 200-200-20 MG/5ML suspension  Generic drug:  alum & mag hydroxide-simeth  Take 30 mLs by mouth every 4 (four) hours as needed for indigestion (upset stomach).     omeprazole 20 MG capsule  Commonly known as:  PRILOSEC  Take 20 mg by mouth daily.     PARoxetine 20 MG tablet  Commonly known as:  PAXIL  Take 20 mg by mouth daily.     potassium chloride 10 MEQ tablet  Commonly known as:  K-DUR  Take 1 tablet by mouth daily.     predniSONE 5 MG tablet  Commonly known as:  DELTASONE  Take 15 tablets by mouth daily with breakfast.     SYSTANE BALANCE 0.6 % Soln  Generic drug:  Propylene Glycol  Place 1 drop into the left eye 2 (two) times daily.     vitamin B-12 500 MCG tablet  Commonly known as:  CYANOCOBALAMIN  Take 1,000 mcg by mouth daily.        Relevant Imaging Results:  Relevant Lab Results:  Recent Labs    Additional Information    Shela Leff, LCSW

## 2015-03-01 NOTE — Care Management Important Message (Signed)
Important Message  Patient Details  Name: Aaron Cook MRN: [id number] Date of Birth: 08-19-1920   Medicare Important Message Given:  Yes-second notification given    Alvie Heidelberg, RN 03/01/2015, 11:46 AM

## 2015-03-01 NOTE — Plan of Care (Signed)
Problem: Education: Goal: Knowledge of disease or condition will improve Outcome: Not Progressing Pt confused Goal: Knowledge of the prescribed therapeutic regimen will improve Outcome: Not Progressing Pt confused Goal: Ability to identify and alter actions that are detrimental to health will improve Outcome: Not Progressing Pt confused  Problem: Health Behavior/Discharge Planning: Goal: Ability to manage health-related needs will improve Outcome: Not Progressing Pt confused  Problem: Coping: Goal: Verbalizations of decreased anxiety will increase Outcome: Not Progressing Pt confused  Problem: Education: Goal: Knowledge of [facility] Education information/materials will improve Outcome: Not Progressing Pt confused

## 2015-03-01 NOTE — Clinical Documentation Improvement (Signed)
Internal Medicine  Possible Conditions?   Encephalopathy - Alcoholic, Anoxic/Hypoxia, Drug Induced/Toxic (specify drug), Hepatic, Hypertensive, Hypoglycemic, Metabolic/Septic, Traumatic/post concussive, [place], Other  Other  Clinically Undetermined  Document any associated diagnoses/conditions.  Please update your documentation within the medical record to reflect your response to this query. Thank you.  Supporting Information:(As per notes) "Altered mental status secondary to sepsis from bilateral pneumonia."  Please exercise your independent, professional judgment when responding. A specific answer is not anticipated or expected.  Thank You, Alessandra Grout, RN, BSN, CCDS,Clinical Documentation Specialist:  [phone]  [phone]=[facility] [place]- Health Information Management

## 2015-03-01 NOTE — Progress Notes (Addendum)
[place] [facility] [place] NAME: Stark Aguinaga    MR#:  [id number]  DATE OF BIRTH:  12/11/1920  SUBJECTIVE:  CHIEF COMPLAINT:   Chief Complaint  Patient presents with  . Code Sepsis    noted weak this am   The patient is a demented, unable to communicate. Daughter is bed-side. REVIEW OF SYSTEMS:  Unable to obtain due to dementia status.  DRUG ALLERGIES:   Allergies  Allergen Reactions  . Atropine Other (See Comments)    Reaction: unknown  . Scopolamine Other (See Comments)    Reaction: unknown    VITALS:  Blood pressure 87/48, pulse 111, temperature 97.6 F (36.4 C), temperature source Oral, resp. rate 17, height 5\' 4"  (1.626 m), weight 78.926 kg (174 lb), SpO2 98 %.  PHYSICAL EXAMINATION:  GENERAL:  81 y.o.-year-old patient lying in the bed with no acute distress.  EYES: Pupils equal, round, reactive to light and accommodation. No scleral icterus. Extraocular muscles intact.  HEENT: Head atraumatic, normocephalic. Oropharynx and nasopharynx clear.  NECK:  Supple, no jugular venous distention. No thyroid enlargement, no tenderness.  LUNGS: Coarse breath sounds bilaterally, bilateral crackles. No use of accessory muscles of respiration.  CARDIOVASCULAR: S1, S2 normal. No murmurs, rubs, or gallops.  ABDOMEN: Soft, nontender, nondistended. Bowel sounds present. No organomegaly or mass.  EXTREMITIES: No pedal edema, cyanosis, or clubbing.  NEUROLOGIC: Patient is demented, does not follow commands. PSYCHIATRIC: The patient is demented . SKIN: No obvious rash, lesion, or ulcer. Multiple bruises and lacerations on all extremities.   LABORATORY PANEL:   CBC  Recent Labs Lab 03/01/15 0457  WBC 12.3*  HGB 9.0*  HCT 27.4*  PLT 124*   ------------------------------------------------------------------------------------------------------------------  Chemistries   Recent Labs Lab 03/01/15 0457  NA 142  K 3.9  CL 109   CO2 22  GLUCOSE 147*  BUN 33*  CREATININE 1.56*  CALCIUM 8.3*  AST 34  ALT 13*  ALKPHOS 59  BILITOT 1.3*   ------------------------------------------------------------------------------------------------------------------  Cardiac Enzymes  Recent Labs Lab 02/28/15 1227  TROPONINI <0.03   ------------------------------------------------------------------------------------------------------------------  RADIOLOGY:  Dg Chest Port 1 View  02/28/2015  CLINICAL DATA:  Fever and weakness; hypoxia EXAM: PORTABLE CHEST 1 VIEW COMPARISON:  January 21, 2015 FINDINGS: There is patchy airspace consolidation in both lower lobes, more on the left than on the right. Lungs elsewhere are clear. The heart is upper normal in size with pulmonary vascularity within normal limits. There is atherosclerotic calcification in the aorta. Patient is status post bilateral total shoulder replacements. IMPRESSION: Areas of airspace consolidation bilaterally, more on the left than on the right, likely pneumonia, although atypical pulmonary edema could present similarly. Lungs elsewhere clear. Cardiac silhouette within normal limits and stable. Atherosclerotic calcification noted. Electronically Signed   By: Lowella Grip III M.D.   On: 02/28/2015 12:24    EKG:   Orders placed or performed during the hospital encounter of 02/28/15  . EKG 12-Lead  . EKG 12-Lead  . ED EKG 12-Lead  . ED EKG 12-Lead    ASSESSMENT AND PLAN:   1. Altered mental status due to acute metabolic encephalopathy, secondary to sepsis from bilateral pneumonia Aspiration and fall precaution.  2. Sepsis secondary to bilateral pneumonia Continue Zosyn and vancomycin for healthcare associated pneumonia. Nebulizer when necessary. Follow-up sputum culture and sensitivity. Follow-up CBC.  3. History of lung mass which was diagnosed during last year Family has decided not to pursue further  4. Chronic atrial fibrillation  currently  rate controlled. Currently patient is nothing by mouth as his altered mentation  Cardizem IV boluses as needed basis for RVR  5. Chronic history of congestive heart failure. Stable.    *Acute renal failure, prerenal,  due to dehydration, start normal saline IV with gentle rehydration and follow-up BMP. *Lactic Acidosis. Follow-up lactic acid level. Continue IV fluid support.  All the records are reviewed and case discussed with Care Management/Social Workerr. Management plans discussed with the patient' daughter and she is in in agreement. Greater than 50% time was spent on coordination of care and face-to-face counseling. CODE STATUS: DO NOT RESUSCITATE  TOTAL TIME TAKING CARE OF THIS PATIENT: 43 minutes.   POSSIBLE D/C IN 3 DAYS, DEPENDING ON CLINICAL CONDITION.   Demetrios Loll M.D on 03/01/2015 at 4:56 PM  Between 7am to 6pm - Pager - [phone]  After 6pm go to [email or web address] - password [email or web address] [facility]  [facility]  Office  [phone]  CC: Primary care physician; No primary care provider on file.

## 2015-03-01 NOTE — Care Management (Signed)
Discussed care with Dr Bridgett Larsson attending. Considering palliative care. Anticipate return to SNF at discharge perhaps

## 2015-03-01 NOTE — Clinical Social Work Note (Signed)
Clinical Social Work Assessment  Patient Details  Name: Aaron Cook MRN: [id number] Date of Birth: 10-15-1920  Date of referral:  03/01/15               Reason for consult:  Facility Placement                Permission sought to share information with:   (patient with dementia and next of kin is his daughter: Aaron Cook: [phone]) Permission granted to share information::     Name::        Agency::     Relationship::     Contact Information:     Housing/Transportation Living arrangements for the past 2 months:  [place] of Information:  Adult Children Patient Interpreter Needed:  None Criminal Activity/Legal Involvement Pertinent to Current Situation/Hospitalization:  No - Comment as needed Significant Relationships:  Adult Children Lives with:  Facility Resident Do you feel safe going back to the place where you live?  Yes (patient's daughter feels secure with [facility]) Need for family participation in patient care:  Yes (Comment)  Care giving concerns:  Patient is a resident of [facility].   Social Worker assessment / plan:  CSW contacted patient's daughter: Aaron Cook: [phone] via phone and she informed CSW that patient has been a resident at [facility] but that she has been talking with [facility] about transitioning patient to a higher level of care.  CSW is requesting a PT consult for recommendations. If needed, patient's daughter would like Radiation protection practitioner for [facility]. CSW spoke with Marden Noble at [facility] and they can take patient for rehab if that is what is recommended. FL2 completed in Epic.  Employment status:  Retired Forensic scientist:    PT Recommendations:    Information / Referral to community resources:     Patient/Family's Response to care:  Patient's daughter was very Patent attorney of CSW call and communication with [facility].  Patient/Family's Understanding of and Emotional  Response to Diagnosis, Current Treatment, and Prognosis:  Patient's daughter is aware that patient may not be able to continue living in the ALF for much longer and is prepared to transition him.   Emotional Assessment Appearance:  Appears stated age Attitude/Demeanor/Rapport:  Unable to Assess (pleasantly confused) Affect (typically observed):  Calm (pleasantly confused) Orientation:  Oriented to Self Alcohol / Substance use:  Not Applicable Psych involvement (Current and /or in the community):  No (Comment)  Discharge Needs  Concerns to be addressed:  Care Coordination Readmission within the last 30 days:  No Current discharge risk:  None Barriers to Discharge:  No Barriers Identified   Shela Leff, LCSW 03/01/2015, 3:01 PM

## 2015-03-01 NOTE — Progress Notes (Signed)
Lab called with critical lactic acid value of 3.2. DR. Hower notified with no new orders given.

## 2015-03-01 NOTE — Progress Notes (Addendum)
ANTIBIOTIC CONSULT NOTE - Follow Up  Pharmacy Consult for VANCOMYCIN/ ZOSYN Indication: pneumonia/?HCAP  Allergies  Allergen Reactions  . Atropine Other (See Comments)    Reaction: unknown  . Scopolamine Other (See Comments)    Reaction: unknown    Patient Measurements: Height: 5\' 4"  (162.6 cm) Weight: 174 lb (78.926 kg) IBW/kg (Calculated) : 59.2 Adjusted Body Weight: 66.4 kg  Vital Signs: Temp: 97.8 F (36.6 C) (11/08 0534) Temp Source: Oral (11/08 0534) BP: 91/43 mmHg (11/08 0534) Pulse Rate: 133 (11/08 0534) Intake/Output from previous day: 11/07 0701 - 11/08 0700 In: 1344.3 [I.V.:344.3; IV Piggyback:1000] Out: 650 [Urine:650] Intake/Output from this shift: Total I/O In: 925.9 [I.V.:877.8; IV Piggyback:48.1] Out: -   Labs:  Recent Labs  02/28/15 1116 02/28/15 1227 03/01/15 0457  WBC 10.9*  --  12.3*  HGB 10.1*  --  9.0*  PLT 133*  --  124*  CREATININE  --  1.08 1.56*   Estimated Creatinine Clearance: 28.7 mL/min (by C-G formula based on Cr of 1.56).   Microbiology: Recent Results (from the past 720 hour(s))  MRSA PCR Screening     Status: None   Collection Time: 02/28/15  7:46 PM  Result Value Ref Range Status   MRSA by PCR NEGATIVE NEGATIVE Final    Comment:        The GeneXpert MRSA Assay (FDA approved for NASAL specimens only), is one component of a comprehensive MRSA colonization surveillance program. It is not intended to diagnose MRSA infection nor to guide or monitor treatment for MRSA infections.     Medical History: Past Medical History  Diagnosis Date  . Diabetes mellitus ([place])   . Incontinence   . Over weight   . BPH (benign prostatic hyperplasia)   . CHF (congestive heart failure) ([place])   . Pneumonia   . Lung mass   . CAD (coronary artery disease)   . A-fib ([place])   . HTN (hypertension)   . Anemia, chronic disease   . Hyperlipemia   . Hypothyroid   . Depression   . Prostate carcinoma ([place])   . Glaucoma      Medications:  Scheduled:  . enoxaparin (LOVENOX) injection  30 mg Subcutaneous Q24H  . fluticasone  2 spray Each Nare Daily  . insulin aspart  0-5 Units Subcutaneous QHS  . insulin aspart  0-9 Units Subcutaneous TID WC  . ipratropium-albuterol  3 mL Nebulization Q6H  . piperacillin-tazobactam (ZOSYN)  IV  3.375 g Intravenous Q8H  . [START ON 03/02/2015] vancomycin  1,000 mg Intravenous Q36H   Anti-infectives    Start     Dose/Rate Route Frequency Ordered Stop   03/02/15 1300  vancomycin (VANCOCIN) IVPB 1000 mg/200 mL premix     1,000 mg 200 mL/hr over 60 Minutes Intravenous Every 36 hours 03/01/15 0948     03/02/15 0100  vancomycin (VANCOCIN) IVPB 1000 mg/200 mL premix  Status:  Discontinued     1,000 mg 200 mL/hr over 60 Minutes Intravenous Every 24 hours 02/28/15 1433 03/01/15 0948   03/01/15 0100  vancomycin (VANCOCIN) IVPB 1000 mg/200 mL premix     1,000 mg 200 mL/hr over 60 Minutes Intravenous  Once 02/28/15 1433 03/01/15 0200   02/28/15 2000  piperacillin-tazobactam (ZOSYN) IVPB 3.375 g     3.375 g 12.5 mL/hr over 240 Minutes Intravenous Every 8 hours 02/28/15 1415     02/28/15 1400  levofloxacin (LEVAQUIN) IVPB 750 mg     750 mg 100 mL/hr over 90 Minutes Intravenous  Once 02/28/15 1345 02/28/15 1843   02/28/15 1115  piperacillin-tazobactam (ZOSYN) IVPB 3.375 g     3.375 g 100 mL/hr over 30 Minutes Intravenous  Once 02/28/15 1110 02/28/15 1159   02/28/15 1115  vancomycin (VANCOCIN) IVPB 1000 mg/200 mL premix     1,000 mg 200 mL/hr over 60 Minutes Intravenous  Once 02/28/15 1110 02/28/15 1316     Assessment: 81 y.o. male brought the ER by EMS from nursing home for fever and weakness, possible sepsis, CXR= consolidation.,?HCAP.  Hx dementia, DM, CHF.  SCr trending up today.  Est CrCl~28.7 mL/min, ke: 0.028, t1/2: 25 h  Goal of Therapy:  Vancomycin trough level 15-20 mcg/ml  Plan:  Based on change in renal function, will transition patient to Vancomycin 1 gm IV  q36h.  Will check trough prior to dose on 11/11 at 0030 (will not be at steady state).    Continue Zosyn 3.375 gm IV q8h per EI protocol.  Pharmacy will continue to follow.  Murrell Converse, PharmD Clinical Pharmacist 03/01/2015

## 2015-03-01 NOTE — Progress Notes (Signed)
Anticoagulation Monitoring  Patient ordered Lovenox 40 mg subq q24h for DVT prophylaxis.  Est CrCl~28 mL/min.  Per anticoagulation policy, will transition patient to Lovenox 30 mg subq q24h.   Pharmacy will continue to monitor per policy.

## 2015-03-02 LAB — BASIC METABOLIC PANEL
Anion gap: 8 (ref 5–15)
BUN: 40 mg/dL — ABNORMAL HIGH (ref 6–20)
CALCIUM: 7.9 mg/dL — AB (ref 8.9–10.3)
CO2: 24 mmol/L (ref 22–32)
CREATININE: 1.43 mg/dL — AB (ref 0.61–1.24)
Chloride: 115 mmol/L — ABNORMAL HIGH (ref 101–111)
GFR, EST AFRICAN AMERICAN: 47 mL/min — AB (ref >60)
GFR, EST NON AFRICAN AMERICAN: 41 mL/min — AB (ref >60)
Glucose, Bld: 134 mg/dL — ABNORMAL HIGH (ref 65–99)
Potassium: 3.5 mmol/L (ref 3.5–5.1)
SODIUM: 147 mmol/L — AB (ref 135–145)

## 2015-03-02 LAB — GLUCOSE, CAPILLARY
GLUCOSE-CAPILLARY: 108 mg/dL — AB (ref 65–99)
GLUCOSE-CAPILLARY: 120 mg/dL — AB (ref 65–99)
GLUCOSE-CAPILLARY: 133 mg/dL — AB (ref 65–99)
GLUCOSE-CAPILLARY: 84 mg/dL (ref 65–99)
Glucose-Capillary: 75 mg/dL (ref 65–99)

## 2015-03-02 LAB — CBC
HCT: 25 % — ABNORMAL LOW (ref 40.0–52.0)
Hemoglobin: 8.2 g/dL — ABNORMAL LOW (ref 13.0–18.0)
MCH: 38.8 pg — ABNORMAL HIGH (ref 26.0–34.0)
MCHC: 33 g/dL (ref 32.0–36.0)
MCV: 117.7 fL — ABNORMAL HIGH (ref 80.0–100.0)
PLATELETS: 101 10*3/uL — AB (ref 150–440)
RBC: 2.13 MIL/uL — AB (ref 4.40–5.90)
RDW: 16.9 % — ABNORMAL HIGH (ref 11.5–14.5)
WBC: 4.8 10*3/uL (ref 3.8–10.6)

## 2015-03-02 LAB — URINE CULTURE: Culture: NO GROWTH

## 2015-03-02 MED ORDER — VANCOMYCIN HCL IN DEXTROSE 1-5 GM/200ML-% IV SOLN
1000.0000 mg | INTRAVENOUS | Status: DC
  Filled 2015-03-02: qty 200

## 2015-03-02 MED ORDER — SODIUM CHLORIDE 0.45 % IV SOLN
INTRAVENOUS | Status: DC
  Administered 2015-03-02 (×2): via INTRAVENOUS

## 2015-03-02 MED ORDER — ENOXAPARIN SODIUM 40 MG/0.4ML ~~LOC~~ SOLN
40.0000 mg | SUBCUTANEOUS | Status: DC
  Administered 2015-03-02 – 2015-03-03 (×2): 40 mg via SUBCUTANEOUS
  Filled 2015-03-02 (×3): qty 0.4

## 2015-03-02 MED ORDER — IPRATROPIUM-ALBUTEROL 0.5-2.5 (3) MG/3ML IN SOLN
3.0000 mL | Freq: Four times a day (QID) | RESPIRATORY_TRACT | Status: DC | PRN
  Administered 2015-03-04: 3 mL via RESPIRATORY_TRACT
  Filled 2015-03-02: qty 3

## 2015-03-02 NOTE — Evaluation (Signed)
Physical Therapy Evaluation Patient Details Name: Aaron Cook MRN: [id number] DOB: October 15, 1920 Today's Date: 03/02/2015   History of Present Illness  Patient is a 81 y/o male that presents with AMS and fever. Patient noted to have a lung mass last year, which is not going to be operated on given his current health. Patient noted to have dementia.   Clinical Impression  PT entered the room and found RN staff attempting to change patient. Patient is severely cognitively declined in this session and is unable to provide any meaningful statements or input during this session. He has ulcerations up and down B LEs and is not likely to have ambulated in quite some time. During this session he requires +1 to +2 max A for rolling and bed mobility. Patient was unable to sit upright for any length of time due to poor balance/cognition. PT will add patient to caseload on a trial basis, per RN patient was more cooperative in the morning.     Follow Up Recommendations SNF (If patient is able to participate)    Equipment Recommendations       Recommendations for Other Services       Precautions / Restrictions        Mobility  Bed Mobility Overal bed mobility: +2 for physical assistance;Needs Assistance Bed Mobility: Sit to Supine;Supine to Sit;Rolling Rolling: +2 for physical assistance;Max assist   Supine to sit: +2 for physical assistance;Max assist Sit to supine: +2 for physical assistance;Max assist   General bed mobility comments: Patient has very poor balance in sitting and is not able to perform directed mobility whatsoever.   Transfers                    Ambulation/Gait                Stairs            Wheelchair Mobility    Modified Rankin (Stroke Patients Only)       Balance Overall balance assessment: Needs assistance   Sitting balance-Leahy Scale: Zero Sitting balance - Comments: Attempted sitting balance, patient began leaning backwards  immediately.  Postural control: Right lateral lean;Posterior lean                                   Pertinent Vitals/Pain Pain Assessment:  (Unable to determine)    Home Living Family/patient expects to be discharged to:: Skilled nursing facility                      Prior Function           Comments: Unable to determine in this session. Given his ulcerations in B LEs, this [name] would be surprised if patient has been ambulatory in some time.      Hand Dominance        Extremity/Trunk Assessment   Upper Extremity Assessment: Difficult to assess due to impaired cognition           Lower Extremity Assessment: Difficult to assess due to impaired cognition (Patient flexes RLE frequently, LLE stays relatively flaccid aside from one instance where it was flexed.)         Communication   Communication:  (Very hard of hearing, unable to communicate with therapist. )  Cognition Arousal/Alertness: Awake/alert Behavior During Therapy: Agitated Overall Cognitive Status: Difficult to assess  General Comments General comments (skin integrity, edema, etc.): Skin tears, ulcerations B LEs, LLE open wound with blood exposed which was communicated to RN.     Exercises        Assessment/Plan    PT Assessment Patient needs continued PT services  PT Diagnosis Generalized weakness   PT Problem List Decreased strength;Decreased safety awareness;Decreased skin integrity;Cardiopulmonary status limiting activity;Decreased balance;Decreased activity tolerance;Decreased knowledge of precautions;Decreased mobility;Decreased cognition  PT Treatment Interventions DME instruction;Gait training;Patient/family education;Functional mobility training;Therapeutic activities;Therapeutic exercise;Balance training   PT Goals (Current goals can be found in the Care Plan section) Acute Rehab PT Goals PT Goal Formulation: Patient unable to  participate in goal setting Time For Goal Achievement: 03/16/15 Potential to Achieve Goals: Fair    Frequency Min 2X/week (Trial basis)   Barriers to discharge        Co-evaluation               End of Session   Activity Tolerance: Treatment limited secondary to agitation Patient left: in bed;with bed alarm set;with nursing/sitter in room Nurse Communication: Mobility status         Time: 0786-7544 PT Time Calculation (min) (ACUTE ONLY): 16 min   Charges:   PT Evaluation $Initial PT Evaluation Tier I: 1 Procedure     PT G Codes:       Kerman Passey, PT, DPT    03/02/2015, 4:47 PM

## 2015-03-02 NOTE — Progress Notes (Signed)
Anticoagulation Monitoring  Patient ordered Lovenox 30 mg subq q24h for DVT prophylaxis.  Renal function improving with  Est CrCl~31 mL/min.  Per anticoagulation policy, will transition patient back to Lovenox 40 mg subq q24h.   Pharmacy will continue to monitor per policy.  Murrell Converse, PharmD Clinical Pharmacist 03/02/2015

## 2015-03-02 NOTE — Evaluation (Signed)
Clinical/Bedside Swallow Evaluation Patient Details  Name: Aaron Cook MRN: [id number] Date of Birth: 04/20/1921  Today's Date: 03/02/2015 Time: SLP Start Time (ACUTE ONLY): 0800 SLP Stop Time (ACUTE ONLY): 0900 SLP Time Calculation (min) (ACUTE ONLY): 60 min  Past Medical History:  Past Medical History  Diagnosis Date  . Diabetes mellitus ([place])   . Incontinence   . Over weight   . BPH (benign prostatic hyperplasia)   . CHF (congestive heart failure) ([place])   . Pneumonia   . Lung mass   . CAD (coronary artery disease)   . A-fib ([place])   . HTN (hypertension)   . Anemia, chronic disease   . Hyperlipemia   . Hypothyroid   . Depression   . Prostate carcinoma ([place])   . Glaucoma    Past Surgical History:  Past Surgical History  Procedure Laterality Date  . Penile prosthesis placement    . Penile prosthesis  removal    . Shoulder replacement    . Replacement total knee     HPI:  Pt is a 81 y.o. male with a known history of Dementia, congestive heart failure, lung mass, hyperlipidemia, chronic atrial fibrillation and essential hypertension and multiple other medical problems is brought into the ED from [facility], nursing home for fever and generalized weakness. The patient was found to be tachycardic but with a normal blood pressure at the time of arrival. Chest x-ray has revealed bilateral pneumonia. Patient is with altered mental status and unable to get any history from him. He is awake this AM and appeared interested in taking po's w/ SLP. Only gave a few phonations for "yes".   Assessment / Plan / Recommendation Clinical Impression  Pt appears to adequately tolerate trials of thin liquids and purees/soft solids broken down w/ no immdiate, overt s/s of aspiration noted when following general aspiration precautions and supported w/ feeding. Pt tolerated trials w/ no apparent decline in respiratory status; no throat clearing or coughing. Oral phase appeared wfl w/ trials  given; appropriate oral clearing b/t trials. Pt required feeding; general aspiration precautions. Pt w/ Cognitive decline requiring verbal/visual cues w/ tasks. Pt would benefit from a Dsy. 2 diet w/ thin liquids w/ aspiration precautions; meds in puree for safer, easier swallowing; feeding assistance at meals. Pt would benefit from continued ST services to ensure appropriate toleration of diet consistency and education w/ family/pt.     Aspiration Risk  Mild (d/t Cognitive status)    Diet Recommendation Dysphagia 2 (Fine chop);Thin   Medication Administration: Whole meds with puree (crushed if nec/able) Compensations: Minimize environmental distractions;Slow rate;Small sips/bites;Multiple dry swallows after each bite/sip    Other  Recommendations Recommended Consults:  (Dietician as [name].) Oral Care Recommendations: Oral care BID;Staff/trained caregiver to provide oral care   Follow Up Recommendations       Frequency and Duration min 2x/week  1 week   Pertinent Vitals/Pain Denied; no grimacing    SLP Swallow Goals  see care plan   Swallow Study Prior Functional Status   resides at [facility]; no charted dysphagia.     General Date of Onset: 02/28/15 Other Pertinent Information: Pt is a 81 y.o. male with a known history of Dementia, congestive heart failure, lung mass, hyperlipidemia, chronic atrial fibrillation and essential hypertension and multiple other medical problems is brought into the ED from [facility], nursing home for fever and generalized weakness. The patient was found to be tachycardic but with a normal blood pressure at the time of arrival.  Chest x-ray has revealed bilateral pneumonia. Patient is with altered mental status and unable to get any history from him. He is awake this AM and appeared interested in taking po's w/ SLP. Only gave a few phonations for "yes". Type of Study: Bedside swallow evaluation Previous Swallow Assessment: none indicated Diet Prior to this  Study: Regular;Thin liquids (possibly?) Temperature Spikes Noted: No (wbc 4.8 down from 12.3) Respiratory Status: Supplemental O2 delivered via (comment) ([place] 4 liters) History of Recent Intubation: No Behavior/Cognition: Alert;Cooperative;Pleasant mood;Confused;Distractible;Requires cueing Oral Cavity - Dentition: Adequate natural dentition/normal for age (but missing few) Self-Feeding Abilities: Needs assist;Needs set up Patient Positioning: Upright in bed Baseline Vocal Quality:  (only phonated few times) Volitional Cough: Cognitively unable to elicit Volitional Swallow: Unable to elicit    Oral/Motor/Sensory Function Overall Oral Motor/Sensory Function: Appears within functional limits for tasks assessed (unable to formally assess d/t Cognitive decline/status) Labial ROM: Within Functional Limits Labial Symmetry: Within Functional Limits Lingual ROM: Within Functional Limits Lingual Symmetry: Within Functional Limits Facial Symmetry: Within Functional Limits   Ice Chips Ice chips: Within functional limits Presentation: Spoon (fed; 5 trials)   Thin Liquid Thin Liquid: Within functional limits Presentation: Cup;Straw;Self Fed (w/ assist to hold cup) Other Comments: piecemealed larger boluses    Nectar Thick Nectar Thick Liquid: Not tested   Honey Thick Honey Thick Liquid: Not tested   Puree Puree: Within functional limits Presentation: Spoon (fed; ~3 ozs total)   Solid   GO    Solid: Within functional limits Presentation:  (fed; 1 trial) Other Comments: limited trials      [name], MS, CCC-SLP  [name] 03/02/2015,9:24 AM

## 2015-03-02 NOTE — Plan of Care (Signed)
Problem: Coping: Goal: Verbalizations of decreased anxiety will increase Outcome: Not Progressing Patient is non verbal

## 2015-03-02 NOTE — Progress Notes (Signed)
Spoke with Pharmacy looks like vanycomycin was discontinued okay not to go back and give 1pm dose.

## 2015-03-02 NOTE — Plan of Care (Signed)
Problem: SLP Dysphagia Goals Goal: Misc Dysphagia Goal Pt will safely tolerate po diet of least restrictive consistency w/ no overt s/s of aspiration noted by Staff/pt/family x3 sessions.    

## 2015-03-02 NOTE — Progress Notes (Addendum)
ANTIBIOTIC CONSULT NOTE - Follow Up  Pharmacy Consult for VANCOMYCIN/ ZOSYN Indication: pneumonia/?HCAP  Allergies  Allergen Reactions  . Atropine Other (See Comments)    Reaction: unknown  . Scopolamine Other (See Comments)    Reaction: unknown    Patient Measurements: Height: 5\' 4"  (162.6 cm) Weight: 170 lb 3.2 oz (77.202 kg) IBW/kg (Calculated) : 59.2 Adjusted Body Weight: 66.4 kg  Vital Signs: Temp: 98.6 F (37 C) (11/09 0619) Temp Source: Oral (11/09 0619) BP: 110/62 mmHg (11/09 0619) Pulse Rate: 107 (11/09 0619) Intake/Output from previous day: 11/08 0701 - 11/09 0700 In: 2232.9 [I.V.:2089.8; IV Piggyback:143.1] Out: 875 [Urine:875] Intake/Output from this shift: Total I/O In: 235 [I.V.:203; IV Piggyback:32] Out: 0   Labs:  Recent Labs  02/28/15 1116 02/28/15 1227 03/01/15 0457 03/02/15 0457  WBC 10.9*  --  12.3* 4.8  HGB 10.1*  --  9.0* 8.2*  PLT 133*  --  124* 101*  CREATININE  --  1.08 1.56* 1.43*   Estimated Creatinine Clearance: 31 mL/min (by C-G formula based on Cr of 1.43).   Microbiology: Recent Results (from the past 720 hour(s))  Blood Culture (routine x 2)     Status: None (Preliminary result)   Collection Time: 02/28/15 11:16 AM  Result Value Ref Range Status   Specimen Description BLOOD LEFT IVSITE  Final   Special Requests   Final    BOTTLES DRAWN AEROBIC AND ANAEROBIC  1CC ANAERO 2CC AERO   Culture NO GROWTH < 24 HOURS  Final   Report Status PENDING  Incomplete  Blood Culture (routine x 2)     Status: None (Preliminary result)   Collection Time: 02/28/15 11:25 AM  Result Value Ref Range Status   Specimen Description BLOOD RIGHT IV  Final   Special Requests   Final    BOTTLES DRAWN AEROBIC AND ANAEROBIC  3CC ANAERO 2 CC AERO   Culture NO GROWTH < 24 HOURS  Final   Report Status PENDING  Incomplete  Urine culture     Status: None (Preliminary result)   Collection Time: 02/28/15 11:39 AM  Result Value Ref Range Status   Specimen  Description URINE, RANDOM  Final   Special Requests NONE  Final   Culture NO GROWTH < 24 HOURS  Final   Report Status PENDING  Incomplete  MRSA PCR Screening     Status: None   Collection Time: 02/28/15  7:46 PM  Result Value Ref Range Status   MRSA by PCR NEGATIVE NEGATIVE Final    Comment:        The GeneXpert MRSA Assay (FDA approved for NASAL specimens only), is one component of a comprehensive MRSA colonization surveillance program. It is not intended to diagnose MRSA infection nor to guide or monitor treatment for MRSA infections.     Medical History: Past Medical History  Diagnosis Date  . Diabetes mellitus ([place])   . Incontinence   . Over weight   . BPH (benign prostatic hyperplasia)   . CHF (congestive heart failure) ([place])   . Pneumonia   . Lung mass   . CAD (coronary artery disease)   . A-fib ([place])   . HTN (hypertension)   . Anemia, chronic disease   . Hyperlipemia   . Hypothyroid   . Depression   . Prostate carcinoma ([place])   . Glaucoma     Medications:  Scheduled:  . enoxaparin (LOVENOX) injection  40 mg Subcutaneous Q24H  . fluticasone  2 spray Each Nare Daily  .  insulin aspart  0-5 Units Subcutaneous QHS  . insulin aspart  0-9 Units Subcutaneous TID WC  . ipratropium-albuterol  3 mL Nebulization Q6H  . piperacillin-tazobactam (ZOSYN)  IV  3.375 g Intravenous Q8H  . vancomycin  1,000 mg Intravenous Q36H   Anti-infectives    Start     Dose/Rate Route Frequency Ordered Stop   03/02/15 1300  vancomycin (VANCOCIN) IVPB 1000 mg/200 mL premix     1,000 mg 200 mL/hr over 60 Minutes Intravenous Every 36 hours 03/01/15 0948     03/02/15 0100  vancomycin (VANCOCIN) IVPB 1000 mg/200 mL premix  Status:  Discontinued     1,000 mg 200 mL/hr over 60 Minutes Intravenous Every 24 hours 02/28/15 1433 03/01/15 0948   03/01/15 0100  vancomycin (VANCOCIN) IVPB 1000 mg/200 mL premix     1,000 mg 200 mL/hr over 60 Minutes Intravenous  Once 02/28/15 1433 03/01/15 0200    02/28/15 2000  piperacillin-tazobactam (ZOSYN) IVPB 3.375 g     3.375 g 12.5 mL/hr over 240 Minutes Intravenous Every 8 hours 02/28/15 1415     02/28/15 1400  levofloxacin (LEVAQUIN) IVPB 750 mg     750 mg 100 mL/hr over 90 Minutes Intravenous  Once 02/28/15 1345 02/28/15 1843   02/28/15 1115  piperacillin-tazobactam (ZOSYN) IVPB 3.375 g     3.375 g 100 mL/hr over 30 Minutes Intravenous  Once 02/28/15 1110 02/28/15 1159   02/28/15 1115  vancomycin (VANCOCIN) IVPB 1000 mg/200 mL premix     1,000 mg 200 mL/hr over 60 Minutes Intravenous  Once 02/28/15 1110 02/28/15 1316     Assessment: 81 y.o. male brought the ER by EMS from nursing home for fever and weakness, possible sepsis, CXR= consolidation.,?HCAP.  Hx dementia, DM, CHF. Patient currently ordered Vancomycin 1 gm IV q36h and Zosyn 3.375 gm IV q8h.  SCr trending down today.  Est CrCl~31 mL/min, ke: 0.030, t1/2: 23.1 h  Goal of Therapy:  Vancomycin trough level 15-20 mcg/ml  Plan:  Based on change in renal function, will transition patient to Vancomycin 1 gm IV q24h.  Will keep next dose for 1300 today and then schedule q24h as estimated trough with currently renal function is ~20 with q24h dosing and ~14 with q36h dosing. BMP ordered in AM to continue to follow renal function.  Will reschedule trough for 11/11 at 1230 (not at steady state) for monitoring since renal function has been unstable.    Continue Zosyn 3.375 gm IV q8h per EI protocol.  Pharmacy will continue to follow.  Murrell Converse, PharmD Clinical Pharmacist 03/02/2015

## 2015-03-02 NOTE — Progress Notes (Signed)
[facility] at [place] NAME: Clive Parcel    MR#:  [id number]  DATE OF BIRTH:  08/23/1920  SUBJECTIVE:  CHIEF COMPLAINT:   Chief Complaint  Patient presents with  . Code Sepsis    noted weak this am   The patient is demented, unable to communicate. Daughter is bed-side. REVIEW OF SYSTEMS:  Unable to obtain due to dementia status.  DRUG ALLERGIES:   Allergies  Allergen Reactions  . Atropine Other (See Comments)    Reaction: unknown  . Scopolamine Other (See Comments)    Reaction: unknown    VITALS:  Blood pressure 99/47, pulse 106, temperature 97.4 F (36.3 C), temperature source Oral, resp. rate 18, height 5\' 4"  (1.626 m), weight 77.202 kg (170 lb 3.2 oz), SpO2 98 %.  PHYSICAL EXAMINATION:  GENERAL:  81 y.o.-year-old patient lying in the bed with no acute distress.  EYES: Pupils equal, round, reactive to light and accommodation. No scleral icterus. Extraocular muscles intact.  HEENT: Head atraumatic, normocephalic. Oropharynx and nasopharynx clear.  NECK:  Supple, no jugular venous distention. No thyroid enlargement, no tenderness.  LUNGS: Coarse breath sounds bilaterally, bilateral crackles. No use of accessory muscles of respiration.  CARDIOVASCULAR: S1, S2 normal. No murmurs, rubs, or gallops.  ABDOMEN: Soft, nontender, nondistended. Bowel sounds present. No organomegaly or mass.  EXTREMITIES: No pedal edema, cyanosis, or clubbing.  NEUROLOGIC: Patient is demented, does not follow commands. PSYCHIATRIC: The patient is demented . SKIN: No obvious rash, lesion, or ulcer. Multiple bruises and lacerations on all extremities.   LABORATORY PANEL:   CBC  Recent Labs Lab 03/02/15 0457  WBC 4.8  HGB 8.2*  HCT 25.0*  PLT 101*   ------------------------------------------------------------------------------------------------------------------  Chemistries   Recent Labs Lab 03/01/15 0457 03/02/15 0457  NA 142  147*  K 3.9 3.5  CL 109 115*  CO2 22 24  GLUCOSE 147* 134*  BUN 33* 40*  CREATININE 1.56* 1.43*  CALCIUM 8.3* 7.9*  AST 34  --   ALT 13*  --   ALKPHOS 59  --   BILITOT 1.3*  --    ------------------------------------------------------------------------------------------------------------------  Cardiac Enzymes  Recent Labs Lab 02/28/15 1227  TROPONINI <0.03   ------------------------------------------------------------------------------------------------------------------  RADIOLOGY:  No results found.  EKG:   Orders placed or performed during the hospital encounter of 02/28/15  . EKG 12-Lead  . EKG 12-Lead  . ED EKG 12-Lead  . ED EKG 12-Lead    ASSESSMENT AND PLAN:   1. Altered mental status due to acute metabolic encephalopathy, secondary to sepsis from bilateral pneumonia Aspiration and fall precaution.  2. Sepsis secondary to bilateral pneumonia Continue Zosyn and discontinue vancomycin. Nebulizer when necessary. Follow-up sputum culture and sensitivity. Follow-up CBC.  3. History of lung mass which was diagnosed during last year Family has decided not to pursue further  4. Chronic atrial fibrillation. Mild tachycardia at 106. Unable to start Lopressor due to low blood pressure.Marland Kitchen He is not a candidate anticoagulation.  5. Chronic history of congestive heart failure. Stable.   Hypernatremia. Change from normal saline to half-normal saline and follow-up BMP. *Acute renal failure, prerenal,  due to dehydration, start normal saline IV with gentle rehydration and follow-up BMP. *Lactic Acidosis. Follow-up lactic acid level. Continue IV fluid support.  Anemia of chronic disease. Hemoglobin decreased to 8.2. No active bleeding. Follow-up hemoglobin. Thrombocytopenia. Follow-up CBC. DM2, continue sliding scale.  All the records are reviewed and case discussed with Care Management/Social Workerr. Management plans  discussed with the patient' daughter and she is  in in agreement. Greater than 50% time was spent on coordination of care and face-to-face counseling. CODE STATUS: DO NOT RESUSCITATE  TOTAL TIME TAKING CARE OF THIS PATIENT: 38 minutes.   POSSIBLE D/C IN 3 DAYS, DEPENDING ON CLINICAL CONDITION.   Demetrios Loll M.D on 03/02/2015 at 2:20 PM  Between 7am to 6pm - Pager - [phone]  After 6pm go to [email or web address] - password [email or web address] [facility]  [facility]  Office  [phone]  CC: Primary care physician; No primary care provider on file.

## 2015-03-03 LAB — EXPECTORATED SPUTUM ASSESSMENT W GRAM STAIN, RFLX TO RESP C

## 2015-03-03 LAB — BASIC METABOLIC PANEL
Anion gap: 8 (ref 5–15)
BUN: 26 mg/dL — ABNORMAL HIGH (ref 6–20)
CHLORIDE: 109 mmol/L (ref 101–111)
CO2: 26 mmol/L (ref 22–32)
CREATININE: 1.08 mg/dL (ref 0.61–1.24)
Calcium: 7.7 mg/dL — ABNORMAL LOW (ref 8.9–10.3)
GFR calc non Af Amer: 57 mL/min — ABNORMAL LOW (ref >60)
Glucose, Bld: 102 mg/dL — ABNORMAL HIGH (ref 65–99)
POTASSIUM: 2.7 mmol/L — AB (ref 3.5–5.1)
SODIUM: 143 mmol/L (ref 135–145)

## 2015-03-03 LAB — GLUCOSE, CAPILLARY
GLUCOSE-CAPILLARY: 87 mg/dL (ref 65–99)
GLUCOSE-CAPILLARY: 81 mg/dL (ref 65–99)
Glucose-Capillary: 87 mg/dL (ref 65–99)
Glucose-Capillary: 78 mg/dL (ref 65–99)

## 2015-03-03 LAB — EXPECTORATED SPUTUM ASSESSMENT W REFEX TO RESP CULTURE

## 2015-03-03 LAB — LACTIC ACID, PLASMA: Lactic Acid, Venous: 1.1 mmol/L (ref 0.5–2.0)

## 2015-03-03 LAB — CBC
HEMATOCRIT: 22.8 % — AB (ref 40.0–52.0)
Hemoglobin: 7.4 g/dL — ABNORMAL LOW (ref 13.0–18.0)
MCH: 38.1 pg — AB (ref 26.0–34.0)
MCHC: 32.4 g/dL (ref 32.0–36.0)
MCV: 117.7 fL — AB (ref 80.0–100.0)
Platelets: 100 10*3/uL — ABNORMAL LOW (ref 150–440)
RBC: 1.93 MIL/uL — AB (ref 4.40–5.90)
RDW: 17.2 % — ABNORMAL HIGH (ref 11.5–14.5)
WBC: 3.2 10*3/uL — AB (ref 3.8–10.6)

## 2015-03-03 LAB — MAGNESIUM: MAGNESIUM: 1.7 mg/dL (ref 1.7–2.4)

## 2015-03-03 MED ORDER — HALOPERIDOL LACTATE 5 MG/ML IJ SOLN
1.0000 mg | Freq: Once | INTRAMUSCULAR | Status: AC
  Administered 2015-03-03: 1 mg via INTRAMUSCULAR
  Filled 2015-03-03: qty 1

## 2015-03-03 MED ORDER — POTASSIUM CHLORIDE 20 MEQ/15ML (10%) PO SOLN
60.0000 meq | Freq: Once | ORAL | Status: AC
  Administered 2015-03-03: 60 meq via ORAL
  Filled 2015-03-03: qty 45
  Filled 2015-03-03: qty 15
  Filled 2015-03-03: qty 45

## 2015-03-03 MED ORDER — POTASSIUM CHLORIDE 20 MEQ/15ML (10%) PO SOLN
60.0000 meq | Freq: Once | ORAL | Status: AC
Start: 2015-03-03 — End: 2015-03-03
  Administered 2015-03-03: 60 meq via ORAL
  Filled 2015-03-03: qty 45

## 2015-03-03 NOTE — Consult Note (Signed)
Palliative Care Consult with patient and daughter to discuss goals of care. Patient is extremely hard of hearing and disoriented during this visit. Discussion held with patient's daughter/leagal guardian Aaron Cook. Discussed patient's current status and goals upon discharge. At this time, Aaron Cook feels as though patient's symptoms have improved since he was first admitted, however patient has not yet returned to his baseline. Aaron Cook states that patient had a similar event back in 2014 that resulted in discharge back to the facility for rehab, which she feels was unsuccessful. Due to patient's declining cognitive status and limited physical functioning, Aaron Cook feels as though rehab would not be beneficial at this point. There have already been discussions between the facility and Aaron Cook, over the past month, to determine if patient needs a higher level of care as he currently resides in the ALF portion of the facility. Aaron Cook desires for patient to return to [facility] and be transitioned to SNF level of care if facility is agreeable. This information relayed to Aaron Cook, [place] who is in communication with [facility] regarding family's preference. Patient information will be faxed over to hospice for Palliative Care referral to be followed in the facility upon patient's discharge from this hospitalization. At that time, patient to be assessed and followed to determine when patient meets eligibility for Hospice services. Family in agreement with this plan.

## 2015-03-03 NOTE — Progress Notes (Addendum)
[facility] at [place] NAME: Aaron Cook    MR#:  [id number]  DATE OF BIRTH:  July 19, 1920  SUBJECTIVE:  CHIEF COMPLAINT:   Chief Complaint  Patient presents with  . Code Sepsis    noted weak this am   The patient is demented, unable to communicate. Daughter is bed-side. REVIEW OF SYSTEMS:  Unable to obtain due to dementia status.  DRUG ALLERGIES:   Allergies  Allergen Reactions  . Atropine Other (See Comments)    Reaction: unknown  . Scopolamine Other (See Comments)    Reaction: unknown    VITALS:  Blood pressure 117/49, pulse 104, temperature 98.5 F (36.9 C), temperature source Oral, resp. rate 20, height 5\' 4"  (1.626 m), weight 77.429 kg (170 lb 11.2 oz), SpO2 100 %.  PHYSICAL EXAMINATION:  GENERAL:  81 y.o.-year-old patient lying in the bed with no acute distress.  EYES: Pupils equal, round, reactive to light and accommodation. No scleral icterus. Extraocular muscles intact.  HEENT: Head atraumatic, normocephalic. Moist oral mucosa. NECK:  Supple, no jugular venous distention. No thyroid enlargement, no tenderness.  LUNGS: Coarse breath sounds bilaterally, bilateral crackles. No use of accessory muscles of respiration.  CARDIOVASCULAR: S1, S2 normal. No murmurs, rubs, or gallops.  ABDOMEN: Soft, nontender, nondistended. Bowel sounds present. No organomegaly or mass.  EXTREMITIES: No pedal edema, cyanosis, or clubbing.  NEUROLOGIC: Patient is demented, does not follow commands. PSYCHIATRIC: The patient is demented . SKIN: No obvious rash, lesion, or ulcer. Multiple bruises and lacerations on all extremities.   LABORATORY PANEL:   CBC  Recent Labs Lab 03/03/15 0655  WBC 3.2*  HGB 7.4*  HCT 22.8*  PLT 100*   ------------------------------------------------------------------------------------------------------------------  Chemistries   Recent Labs Lab 03/01/15 0457  03/03/15 0655  NA 142  < > 143   K 3.9  < > 2.7*  CL 109  < > 109  CO2 22  < > 26  GLUCOSE 147*  < > 102*  BUN 33*  < > 26*  CREATININE 1.56*  < > 1.08  CALCIUM 8.3*  < > 7.7*  MG  --   --  1.7  AST 34  --   --   ALT 13*  --   --   ALKPHOS 59  --   --   BILITOT 1.3*  --   --   < > = values in this interval not displayed. ------------------------------------------------------------------------------------------------------------------  Cardiac Enzymes  Recent Labs Lab 02/28/15 1227  TROPONINI <0.03   ------------------------------------------------------------------------------------------------------------------  RADIOLOGY:  No results found.  EKG:   Orders placed or performed during the hospital encounter of 02/28/15  . EKG 12-Lead  . EKG 12-Lead  . ED EKG 12-Lead  . ED EKG 12-Lead    ASSESSMENT AND PLAN:   1. Altered mental status due to acute metabolic encephalopathy, secondary to sepsis from bilateral pneumonia Aspiration and fall precaution.  2. Sepsis secondary to bilateral pneumonia Continue Zosyn and discontinued vancomycin. Nebulizer when necessary. Blood culture is negative. Follow-up CBC.  3. History of lung mass which was diagnosed during last year Family has decided not to pursue further  4. Chronic atrial fibrillation. Mild tachycardia. Unable to start Lopressor due to low blood pressure. He is not a candidate anticoagulation.  5. Chronic congestive heart failure. Unclear systolic or diastolic. Stable. I don't think it is necessary to get echo and cardiology consult to investigate whether the patient has systolic or diastolic dysfunction.  Hypernatremia. Improved and  discontinue half-normal saline.  *Acute renal failure, prerenal,  due to dehydration, improved, discontinue normal saline IV.  *Lactic Acidosis. Follow-up lactic acid level. Continue IV fluid support.  Anemia of chronic disease. Hemoglobin decreased to 7.4. No active bleeding. Possible due to IV fluid dilution.  Follow-up hemoglobin. Thrombocytopenia. Follow-up CBC. DM2, continue sliding scale.   Palliative care consult. All the records are reviewed and case discussed with Care Management/Social Workerr. Management plans discussed with the patient' daughter and she is in in agreement. Greater than 50% time was spent on coordination of care and face-to-face counseling. CODE STATUS: DO NOT RESUSCITATE  TOTAL TIME TAKING CARE OF THIS PATIENT: 39 minutes.   POSSIBLE D/C IN 3 DAYS, DEPENDING ON CLINICAL CONDITION.   Aaron Cook M.D on 03/03/2015 at 2:18 PM  Between 7am to 6pm - Pager - [phone]  After 6pm go to [email or web address] - password [email or web address]  [place] [facility]  Office  [phone]  CC: Primary care physician; No primary care provider on file.

## 2015-03-03 NOTE — Clinical Documentation Improvement (Signed)
Internal Medicine  Can the diagnosis of CHF be further specified?    Acuity - Acute, Chronic, Acute on Chronic   Type - Systolic, Diastolic, Systolic and Diastolic  Other  Clinically Undetermined  Please update your documentation within the medical record to reflect your response to this query. Thank you.  Document any associated diagnoses/conditions  Supporting Information:(as per notes) "male with a known history of congestive heart failure"  Please exercise your independent, professional judgment when responding. A specific answer is not anticipated or expected.  Thank You, Alessandra Grout, RN, BSN, CCDS,Clinical Documentation Specialist:  [phone]  [phone]=[facility]- Health Information Management

## 2015-03-04 LAB — CBC
HCT: 24 % — ABNORMAL LOW (ref 40.0–52.0)
HEMOGLOBIN: 8.1 g/dL — AB (ref 13.0–18.0)
MCH: 39.6 pg — AB (ref 26.0–34.0)
MCHC: 33.6 g/dL (ref 32.0–36.0)
MCV: 117.9 fL — ABNORMAL HIGH (ref 80.0–100.0)
PLATELETS: 107 10*3/uL — AB (ref 150–440)
RBC: 2.04 MIL/uL — AB (ref 4.40–5.90)
RDW: 16.9 % — ABNORMAL HIGH (ref 11.5–14.5)
WBC: 4.7 10*3/uL (ref 3.8–10.6)

## 2015-03-04 LAB — BASIC METABOLIC PANEL
ANION GAP: 8 (ref 5–15)
BUN: 19 mg/dL (ref 6–20)
CALCIUM: 7.9 mg/dL — AB (ref 8.9–10.3)
CO2: 24 mmol/L (ref 22–32)
CREATININE: 0.95 mg/dL (ref 0.61–1.24)
Chloride: 110 mmol/L (ref 101–111)
GFR calc non Af Amer: >60 mL/min (ref >60)
Glucose, Bld: 91 mg/dL (ref 65–99)
Potassium: 3 mmol/L — ABNORMAL LOW (ref 3.5–5.1)
SODIUM: 142 mmol/L (ref 135–145)

## 2015-03-04 LAB — GLUCOSE, CAPILLARY
GLUCOSE-CAPILLARY: 75 mg/dL (ref 65–99)
GLUCOSE-CAPILLARY: 79 mg/dL (ref 65–99)

## 2015-03-04 MED ORDER — POTASSIUM CHLORIDE 20 MEQ/15ML (10%) PO SOLN
60.0000 meq | Freq: Once | ORAL | Status: AC
  Administered 2015-03-04: 60 meq via ORAL
  Filled 2015-03-04: qty 45

## 2015-03-04 MED ORDER — POTASSIUM CHLORIDE CRYS ER 20 MEQ PO TBCR: 60.0000 meq | EXTENDED_RELEASE_TABLET | Freq: Once | ORAL | Status: DC

## 2015-03-04 MED ORDER — AMOXICILLIN-POT CLAVULANATE 875-125 MG PO TABS: 1.0000 | ORAL_TABLET | Freq: Two times a day (BID) | ORAL | Status: DC

## 2015-03-04 NOTE — Discharge Summary (Addendum)
[facility] NAME: Jyren Asche    MR#:  [id number]  DATE OF BIRTH:  [birth year]  DATE OF ADMISSION:  02/27/78 ADMITTING PHYSICIAN: Fritzi Mandes, MD  DATE OF DISCHARGE: 03/04/2015 PRIMARY CARE PHYSICIAN: No primary care provider on file.    ADMISSION DIAGNOSIS:  Healthcare-associated pneumonia [J18.9]   DISCHARGE DIAGNOSIS:   Altered mental status due to acute metabolic encephalopathy, secondary to sepsis from bilateral pneumonia Acute renal failure Lactic Acidosis SECONDARY DIAGNOSIS:   Past Medical History  Diagnosis Date  . Diabetes mellitus ([place])   . Incontinence   . Over weight   . BPH (benign prostatic hyperplasia)   . CHF (congestive heart failure) ([place])   . Pneumonia   . Lung mass   . CAD (coronary artery disease)   . A-fib ([place])   . HTN (hypertension)   . Anemia, chronic disease   . Hyperlipemia   . Hypothyroid   . Depression   . Prostate carcinoma ([place])   . Glaucoma     HOSPITAL COURSE:   1. Altered mental status due to acute metabolic encephalopathy, secondary to sepsis from bilateral pneumonia Aspiration and fall precaution.  2. Sepsis secondary to bilateral pneumonia He has been treated wtih Zosyn. Vancomycin was discontinued. Nebulizer when necessary. Blood culture is negative.  CBC is normal. Change to augmentin bid for 7 days after discharge.  3. History of lung mass which was diagnosed during last year Family has decided not to pursue further  4. Chronic atrial fibrillation. Mild tachycardia. Unable to start Lopressor due to low blood pressure. He is not a candidate anticoagulation. BP is normal.  5. Chronic congestive heart failure. Unclear systolic or diastolic. Stable. I don't think it is necessary to get echo and cardiology consult to investigate whether the patient has systolic or diastolic dysfunction.  Hypernatremia. Improved after half-normal saline.  *Acute renal failure,  prerenal, due to dehydration, improved after treatment of normal saline IV.  *Lactic Acidosis. Improved.  Anemia of chronic disease. Hemoglobin is stable. No active bleeding.  Thrombocytopenia. Stable.  DM2, controlled.  Hypokalemia. Given KCl, f/u BMP as outpatient. Mag is normal.  DISCHARGE CONDITIONS:   Stable, discharge to SNF today.  CONSULTS OBTAINED:  Treatment Team:  Nicholes Mango, MD  DRUG ALLERGIES:   Allergies  Allergen Reactions  . Atropine Other (See Comments)    Reaction: unknown  . Scopolamine Other (See Comments)    Reaction: unknown    DISCHARGE MEDICATIONS:   Current Discharge Medication List    START taking these medications   Details  amoxicillin-clavulanate (AUGMENTIN) 875-125 MG tablet Take 1 tablet by mouth 2 (two) times daily. Qty: 14 tablet, Refills: 0      CONTINUE these medications which have NOT CHANGED   Details  acetaminophen (TYLENOL) 325 MG tablet Take 650 mg by mouth every 4 (four) hours as needed for moderate pain or fever.     alendronate (FOSAMAX) 70 MG tablet Take 70 mg by mouth once a week. Take with a full glass of water on an empty stomach.    ALPRAZolam (NIRAVAM) 0.25 MG dissolvable tablet Take 0.25 mg by mouth every 6 (six) hours as needed for anxiety.    alum & mag hydroxide-simeth (MYLANTA) [id number] MG/5ML suspension Take 30 mLs by mouth every 4 (four) hours as needed for indigestion (upset stomach).    brimonidine (ALPHAGAN) 0.2 % ophthalmic solution Place 1 drop into the right eye 2 (two) times daily.  Calcium-Magnesium-Vitamin D (CALCIUM 500 PO) Take 1 tablet by mouth daily.    Cholecalciferol 5000 UNITS capsule Take 5,000 Units by mouth once a week.    docusate sodium (COLACE) 100 MG capsule Take 100 mg by mouth 2 (two) times daily as needed for mild constipation.     Ergocalciferol 2000 UNITS TABS Take 1 tablet by mouth daily.    ferrous sulfate 325 (65 FE) MG tablet Take 325 mg by mouth daily with  breakfast.    finasteride (PROSCAR) 5 MG tablet Take 5 mg by mouth daily.    fluticasone (FLONASE) 50 MCG/ACT nasal spray Place 2 sprays into both nostrils daily.     folic acid (FOLVITE) 1 MG tablet Take 3 mg by mouth daily.     furosemide (LASIX) 20 MG tablet Take 20 mg by mouth.    guaiFENesin (ROBITUSSIN) 100 MG/5ML liquid Take 200 mg by mouth every 4 (four) hours as needed for cough.    hydroxypropyl methylcellulose / hypromellose (ISOPTO TEARS / GONIOVISC) 2.5 % ophthalmic solution Place 1 drop into both eyes 2 (two) times daily as needed for dry eyes (itchy eyes).    ipratropium-albuterol (DUONEB) 0.5-2.5 (3) MG/3ML SOLN Take 3 mLs by nebulization every 4 (four) hours as needed (cough/ wheezing).    latanoprost (XALATAN) 0.005 % ophthalmic solution Place 1 drop into the right eye at bedtime.     levothyroxine (SYNTHROID, LEVOTHROID) 137 MCG tablet Take 137 mcg by mouth daily before breakfast.    magnesium hydroxide (MILK OF MAGNESIA) 400 MG/5ML suspension Take 30 mLs by mouth daily as needed for mild constipation.     methotrexate (RHEUMATREX) 2.5 MG tablet Take 10 mg by mouth once a week. Caution:Chemotherapy. Protect from light.    montelukast (SINGULAIR) 10 MG tablet Take 10 mg by mouth daily.     omeprazole (PRILOSEC) 20 MG capsule Take 20 mg by mouth daily.    PARoxetine (PAXIL) 20 MG tablet Take 20 mg by mouth daily.    potassium chloride (K-DUR) 10 MEQ tablet Take 1 tablet by mouth daily.    predniSONE (DELTASONE) 5 MG tablet Take 15 tablets by mouth daily with breakfast.     Propylene Glycol (SYSTANE BALANCE) 0.6 % SOLN Place 1 drop into the left eye 2 (two) times daily.     vitamin B-12 (CYANOCOBALAMIN) 500 MCG tablet Take 1,000 mcg by mouth daily.       STOP taking these medications     fentaNYL (DURAGESIC - DOSED MCG/HR) 12 MCG/HR      HYDROcodone-acetaminophen (NORCO/VICODIN) 5-325 MG tablet          DISCHARGE INSTRUCTIONS:    If you experience  worsening of your admission symptoms, develop shortness of breath, life threatening emergency, suicidal or homicidal thoughts you must seek medical attention immediately by calling 911 or calling your MD immediately  if symptoms less severe.  You Must read complete instructions/literature along with all the possible adverse reactions/side effects for all the Medicines you take and that have been prescribed to you. Take any new Medicines after you have completely understood and accept all the possible adverse reactions/side effects.   Please note  You were cared for by a hospitalist during your hospital stay. If you have any questions about your discharge medications or the care you received while you were in the hospital after you are discharged, you can call the unit and asked to speak with the hospitalist on call if the hospitalist that took care of you is not  available. Once you are discharged, your primary care physician will handle any further medical issues. Please note that NO REFILLS for any discharge medications will be authorized once you are discharged, as it is imperative that you return to your primary care physician (or establish a relationship with a primary care physician if you do not have one) for your aftercare needs so that they can reassess your need for medications and monitor your lab values.    Today   SUBJECTIVE   Demented and noncommunicative.   VITAL SIGNS:  Blood pressure 121/54, pulse 104, temperature 98.2 F (36.8 C), temperature source Oral, resp. rate 16, height 5\' 4"  (1.626 m), weight 75.796 kg (167 lb 1.6 oz), SpO2 73 %.  I/O:   Intake/Output Summary (Last 24 hours) at 03/04/15 0919 Last data filed at 03/04/15 0553  Gross per 24 hour  Intake 284.01 ml  Output    770 ml  Net -485.99 ml    PHYSICAL EXAMINATION:  GENERAL:  81 y.o.-year-old patient lying in the bed with no acute distress.  EYES: Pupils equal, round, reactive to light and accommodation.  No scleral icterus. Extraocular muscles intact.  HEENT: Head atraumatic, normocephalic. Moist oral mucosa. NECK:  Supple, no jugular venous distention. No thyroid enlargement, no tenderness.  LUNGS: Normal breath sounds bilaterally, no wheezing, mild crackles. No use of accessory muscles of respiration.  CARDIOVASCULAR: S1, S2 normal. No murmurs, rubs, or gallops.  ABDOMEN: Soft, non-tender, non-distended. Bowel sounds present. No organomegaly or mass.  EXTREMITIES: No pedal edema, cyanosis, or clubbing.  NEUROLOGIC: unable to exam. PSYCHIATRIC: The patient is demented. SKIN: bruises on arms and legs.  DATA REVIEW:   CBC  Recent Labs Lab 03/04/15 0438  WBC 4.7  HGB 8.1*  HCT 24.0*  PLT 107*    Chemistries   Recent Labs Lab 03/01/15 0457  03/03/15 0655 03/04/15 0438  NA 142  < > 143 142  K 3.9  < > 2.7* 3.0*  CL 109  < > 109 110  CO2 22  < > 26 24  GLUCOSE 147*  < > 102* 91  BUN 33*  < > 26* 19  CREATININE 1.56*  < > 1.08 0.95  CALCIUM 8.3*  < > 7.7* 7.9*  MG  --   --  1.7  --   AST 34  --   --   --   ALT 13*  --   --   --   ALKPHOS 59  --   --   --   BILITOT 1.3*  --   --   --   < > = values in this interval not displayed.  Cardiac Enzymes  Recent Labs Lab 02/28/15 1227  TROPONINI <0.03    Microbiology Results  Results for orders placed or performed during the hospital encounter of 02/28/15  Blood Culture (routine x 2)     Status: None (Preliminary result)   Collection Time: 02/28/15 11:16 AM  Result Value Ref Range Status   Specimen Description BLOOD LEFT IVSITE  Final   Special Requests   Final    BOTTLES DRAWN AEROBIC AND ANAEROBIC  1CC ANAERO 2CC AERO   Culture NO GROWTH 4 DAYS  Final   Report Status PENDING  Incomplete  Blood Culture (routine x 2)     Status: None (Preliminary result)   Collection Time: 02/28/15 11:25 AM  Result Value Ref Range Status   Specimen Description BLOOD RIGHT IV  Final   Special Requests   Final  BOTTLES DRAWN  AEROBIC AND ANAEROBIC  3CC ANAERO 2 CC AERO   Culture NO GROWTH 4 DAYS  Final   Report Status PENDING  Incomplete  Urine culture     Status: None   Collection Time: 02/28/15 11:39 AM  Result Value Ref Range Status   Specimen Description URINE, RANDOM  Final   Special Requests NONE  Final   Culture NO GROWTH 2 DAYS  Final   Report Status 03/02/2015 FINAL  Final  MRSA PCR Screening     Status: None   Collection Time: 02/28/15  7:46 PM  Result Value Ref Range Status   MRSA by PCR NEGATIVE NEGATIVE Final    Comment:        The GeneXpert MRSA Assay (FDA approved for NASAL specimens only), is one component of a comprehensive MRSA colonization surveillance program. It is not intended to diagnose MRSA infection nor to guide or monitor treatment for MRSA infections.   Culture, sputum-assessment     Status: None   Collection Time: 03/03/15  2:22 PM  Result Value Ref Range Status   Specimen Description SPUTUM  Final   Special Requests Immunocompromised  Final   Sputum evaluation THIS SPECIMEN IS ACCEPTABLE FOR SPUTUM CULTURE  Final   Report Status 03/03/2015 FINAL  Final    RADIOLOGY:  No results found.      Management plans discussed with the patient, family and they are in agreement.  CODE STATUS:     Code Status Orders        Start     Ordered   02/28/15 1823  Do not attempt resuscitation (DNR)   Continuous    Question Answer Comment  In the event of cardiac or respiratory ARREST Do not call a "code blue"   In the event of cardiac or respiratory ARREST Do not perform Intubation, CPR, defibrillation or ACLS   In the event of cardiac or respiratory ARREST Use medication by any route, position, wound care, and other measures to relive pain and suffering. May use oxygen, suction and manual treatment of airway obstruction as needed for comfort.   Comments RN may pronounce      02/28/15 1822      TOTAL TIME TAKING CARE OF THIS PATIENT: 36 minutes.    Demetrios Loll M.D  on 03/04/2015 at 9:19 AM  Between 7am to 6pm - Pager - [phone]  After 6pm go to [email or web address] - password [email or web address] [email or web address]  [facility]  Office  [phone]  CC: Primary care physician; No primary care provider on file.

## 2015-03-04 NOTE — Discharge Instructions (Signed)
Heart healthy and ADA diet. Activity as tolerated. Aspiration and fall precaution.

## 2015-03-04 NOTE — Progress Notes (Signed)
Pt left unit via EMS for transport to [facility]; dressings CDI (changed earlier in shift per order, see flowsheets), foley in place and perineal area clean, no s/s of pain or pt distress at time of pick up

## 2015-03-04 NOTE — Progress Notes (Signed)
Pt d/c to [facility]; report called to Conni Slipper, LPN at [facility]; EMS notified to transfer pt

## 2015-03-04 NOTE — Progress Notes (Signed)
ANTIBIOTIC CONSULT NOTE - Follow Up Day 4 of Therapy   Pharmacy Consult for ZOSYN Indication: pneumonia/?HCAP  Allergies  Allergen Reactions  . Atropine Other (See Comments)    Reaction: unknown  . Scopolamine Other (See Comments)    Reaction: unknown    Patient Measurements: Height: 5\' 4"  (162.6 cm) Weight: 167 lb 1.6 oz (75.796 kg) IBW/kg (Calculated) : 59.2 Adjusted Body Weight: 66.4 kg  Vital Signs: Temp: 98.2 F (36.8 C) (11/11 0553) Temp Source: Oral (11/11 0553) BP: 121/54 mmHg (11/11 0553) Pulse Rate: 104 (11/11 0553) Intake/Output from previous day: 11/10 0701 - 11/11 0700 In: 292.8 [P.O.:180; I.V.:7.7; IV Piggyback:105.1] Out: 770 [Urine:770] Intake/Output from this shift: Total I/O In: 240 [P.O.:240] Out: 0   Labs:  Recent Labs  03/02/15 0457 03/03/15 0655 03/04/15 0438  WBC 4.8 3.2* 4.7  HGB 8.2* 7.4* 8.1*  PLT 101* 100* 107*  CREATININE 1.43* 1.08 0.95   Estimated Creatinine Clearance: 46.2 mL/min (by C-G formula based on Cr of 0.95).   Microbiology: Recent Results (from the past 720 hour(s))  Blood Culture (routine x 2)     Status: None (Preliminary result)   Collection Time: 02/28/15 11:16 AM  Result Value Ref Range Status   Specimen Description BLOOD LEFT IVSITE  Final   Special Requests   Final    BOTTLES DRAWN AEROBIC AND ANAEROBIC  1CC ANAERO 2CC AERO   Culture NO GROWTH 4 DAYS  Final   Report Status PENDING  Incomplete  Blood Culture (routine x 2)     Status: None (Preliminary result)   Collection Time: 02/28/15 11:25 AM  Result Value Ref Range Status   Specimen Description BLOOD RIGHT IV  Final   Special Requests   Final    BOTTLES DRAWN AEROBIC AND ANAEROBIC  3CC ANAERO 2 CC AERO   Culture NO GROWTH 4 DAYS  Final   Report Status PENDING  Incomplete  Urine culture     Status: None   Collection Time: 02/28/15 11:39 AM  Result Value Ref Range Status   Specimen Description URINE, RANDOM  Final   Special Requests NONE  Final    Culture NO GROWTH 2 DAYS  Final   Report Status 03/02/2015 FINAL  Final  MRSA PCR Screening     Status: None   Collection Time: 02/28/15  7:46 PM  Result Value Ref Range Status   MRSA by PCR NEGATIVE NEGATIVE Final    Comment:        The GeneXpert MRSA Assay (FDA approved for NASAL specimens only), is one component of a comprehensive MRSA colonization surveillance program. It is not intended to diagnose MRSA infection nor to guide or monitor treatment for MRSA infections.   Culture, sputum-assessment     Status: None   Collection Time: 03/03/15  2:22 PM  Result Value Ref Range Status   Specimen Description SPUTUM  Final   Special Requests Immunocompromised  Final   Sputum evaluation THIS SPECIMEN IS ACCEPTABLE FOR SPUTUM CULTURE  Final   Report Status 03/03/2015 FINAL  Final  Culture, respiratory (NON-Expectorated)     Status: None (Preliminary result)   Collection Time: 03/03/15  2:22 PM  Result Value Ref Range Status   Specimen Description SPUTUM  Final   Special Requests Immunocompromised Reflexed from [id number]  Final   Gram Stain Consistent with normal respiratory flora.  Final   Culture PENDING  Incomplete   Report Status PENDING  Incomplete    Medical History: Past Medical History  Diagnosis Date  .  Diabetes mellitus ([place])   . Incontinence   . Over weight   . BPH (benign prostatic hyperplasia)   . CHF (congestive heart failure) ([place])   . Pneumonia   . Lung mass   . CAD (coronary artery disease)   . A-fib ([place])   . HTN (hypertension)   . Anemia, chronic disease   . Hyperlipemia   . Hypothyroid   . Depression   . Prostate carcinoma ([place])   . Glaucoma     Medications:  Scheduled:  . enoxaparin (LOVENOX) injection  40 mg Subcutaneous Q24H  . fluticasone  2 spray Each Nare Daily  . insulin aspart  0-5 Units Subcutaneous QHS  . insulin aspart  0-9 Units Subcutaneous TID WC  . piperacillin-tazobactam (ZOSYN)  IV  3.375 g Intravenous Q8H   Anti-infectives     Start     Dose/Rate Route Frequency Ordered Stop   03/04/15 0000  amoxicillin-clavulanate (AUGMENTIN) 875-125 MG tablet     1 tablet Oral 2 times daily 03/04/15 0918     03/02/15 1300  vancomycin (VANCOCIN) IVPB 1000 mg/200 mL premix  Status:  Discontinued     1,000 mg 200 mL/hr over 60 Minutes Intravenous Every 36 hours 03/01/15 0948 03/02/15 0954   03/02/15 1300  vancomycin (VANCOCIN) IVPB 1000 mg/200 mL premix  Status:  Discontinued     1,000 mg 200 mL/hr over 60 Minutes Intravenous Every 24 hours 03/02/15 0954 03/02/15 1432   03/02/15 0100  vancomycin (VANCOCIN) IVPB 1000 mg/200 mL premix  Status:  Discontinued     1,000 mg 200 mL/hr over 60 Minutes Intravenous Every 24 hours 02/28/15 1433 03/01/15 0948   03/01/15 0100  vancomycin (VANCOCIN) IVPB 1000 mg/200 mL premix     1,000 mg 200 mL/hr over 60 Minutes Intravenous  Once 02/28/15 1433 03/01/15 0200   02/28/15 2000  piperacillin-tazobactam (ZOSYN) IVPB 3.375 g     3.375 g 12.5 mL/hr over 240 Minutes Intravenous Every 8 hours 02/28/15 1415     02/28/15 1400  levofloxacin (LEVAQUIN) IVPB 750 mg     750 mg 100 mL/hr over 90 Minutes Intravenous  Once 02/28/15 1345 02/28/15 1843   02/28/15 1115  piperacillin-tazobactam (ZOSYN) IVPB 3.375 g     3.375 g 100 mL/hr over 30 Minutes Intravenous  Once 02/28/15 1110 02/28/15 1159   02/28/15 1115  vancomycin (VANCOCIN) IVPB 1000 mg/200 mL premix     1,000 mg 200 mL/hr over 60 Minutes Intravenous  Once 02/28/15 1110 02/28/15 1316     Assessment: 81 y.o. male brought the ER by EMS from nursing home for fever and weakness, possible sepsis, CXR= consolidation.,?HCAP.  Hx dementia, DM, CHF. Patient previously on Vancomycin and Zosyn. Now currently just on Zosyn therapy.   Scr continues to improve     Goal of Therapy:  Resolution of infectin  Plan:   Continue Zosyn 3.375 gm IV q8h per EI protocol.  Pharmacy will continue to follow.  Murrell Converse, PharmD Clinical  Pharmacist 03/04/2015

## 2015-03-05 LAB — CULTURE, BLOOD (ROUTINE X 2)
CULTURE: NO GROWTH
CULTURE: NO GROWTH

## 2015-03-05 LAB — CULTURE, RESPIRATORY W GRAM STAIN

## 2015-03-05 LAB — CULTURE, RESPIRATORY: CULTURE: NORMAL

## 2015-03-09 ENCOUNTER — Emergency Department: Payer: Medicare Other

## 2015-03-09 ENCOUNTER — Inpatient Hospital Stay
Admit: 2015-03-09 | Discharge: 2015-03-09 | Disposition: A | Discharge status: Home / Self Care | Payer: Medicare Other | Attending: Internal Medicine | Admitting: Internal Medicine

## 2015-03-09 ENCOUNTER — Inpatient Hospital Stay
Admission: EM | Admit: 2015-03-09 | Discharge: 2015-03-10 | DRG: 377 | Disposition: A | Discharge status: Skilled Nursing Facility | Payer: Medicare Other | Attending: Internal Medicine | Admitting: Internal Medicine

## 2015-03-09 DIAGNOSIS — Z66 Do not resuscitate: Secondary | ICD-10-CM | POA: Diagnosis present

## 2015-03-09 DIAGNOSIS — F0390 Unspecified dementia without behavioral disturbance: Secondary | ICD-10-CM | POA: Diagnosis present

## 2015-03-09 DIAGNOSIS — J189 Pneumonia, unspecified organism: Secondary | ICD-10-CM

## 2015-03-09 DIAGNOSIS — E872 Acidosis: Secondary | ICD-10-CM | POA: Diagnosis present

## 2015-03-09 DIAGNOSIS — R4182 Altered mental status, unspecified: Secondary | ICD-10-CM | POA: Diagnosis not present

## 2015-03-09 DIAGNOSIS — R0603 Acute respiratory distress: Secondary | ICD-10-CM

## 2015-03-09 DIAGNOSIS — E039 Hypothyroidism, unspecified: Secondary | ICD-10-CM | POA: Diagnosis present

## 2015-03-09 DIAGNOSIS — Z9889 Other specified postprocedural states: Secondary | ICD-10-CM

## 2015-03-09 DIAGNOSIS — E876 Hypokalemia: Secondary | ICD-10-CM | POA: Diagnosis present

## 2015-03-09 DIAGNOSIS — N400 Enlarged prostate without lower urinary tract symptoms: Secondary | ICD-10-CM | POA: Diagnosis present

## 2015-03-09 DIAGNOSIS — Z8546 Personal history of malignant neoplasm of prostate: Secondary | ICD-10-CM

## 2015-03-09 DIAGNOSIS — Z87891 Personal history of nicotine dependence: Secondary | ICD-10-CM

## 2015-03-09 DIAGNOSIS — I5032 Chronic diastolic (congestive) heart failure: Secondary | ICD-10-CM | POA: Diagnosis present

## 2015-03-09 DIAGNOSIS — R32 Unspecified urinary incontinence: Secondary | ICD-10-CM | POA: Diagnosis present

## 2015-03-09 DIAGNOSIS — Z515 Encounter for palliative care: Secondary | ICD-10-CM | POA: Diagnosis present

## 2015-03-09 DIAGNOSIS — D500 Iron deficiency anemia secondary to blood loss (chronic): Secondary | ICD-10-CM | POA: Diagnosis present

## 2015-03-09 DIAGNOSIS — J8410 Pulmonary fibrosis, unspecified: Secondary | ICD-10-CM | POA: Diagnosis present

## 2015-03-09 DIAGNOSIS — Z96659 Presence of unspecified artificial knee joint: Secondary | ICD-10-CM | POA: Diagnosis present

## 2015-03-09 DIAGNOSIS — R0600 Dyspnea, unspecified: Secondary | ICD-10-CM | POA: Diagnosis not present

## 2015-03-09 DIAGNOSIS — I130 Hypertensive heart and chronic kidney disease with heart failure and stage 1 through stage 4 chronic kidney disease, or unspecified chronic kidney disease: Secondary | ICD-10-CM | POA: Diagnosis present

## 2015-03-09 DIAGNOSIS — K2971 Gastritis, unspecified, with bleeding: Secondary | ICD-10-CM

## 2015-03-09 DIAGNOSIS — D638 Anemia in other chronic diseases classified elsewhere: Secondary | ICD-10-CM | POA: Diagnosis present

## 2015-03-09 DIAGNOSIS — E870 Hyperosmolality and hypernatremia: Secondary | ICD-10-CM | POA: Diagnosis present

## 2015-03-09 DIAGNOSIS — R404 Transient alteration of awareness: Secondary | ICD-10-CM

## 2015-03-09 DIAGNOSIS — K922 Gastrointestinal hemorrhage, unspecified: Secondary | ICD-10-CM | POA: Diagnosis not present

## 2015-03-09 DIAGNOSIS — Z96619 Presence of unspecified artificial shoulder joint: Secondary | ICD-10-CM | POA: Diagnosis present

## 2015-03-09 DIAGNOSIS — J9621 Acute and chronic respiratory failure with hypoxia: Secondary | ICD-10-CM | POA: Diagnosis present

## 2015-03-09 DIAGNOSIS — I2510 Atherosclerotic heart disease of native coronary artery without angina pectoris: Secondary | ICD-10-CM | POA: Diagnosis present

## 2015-03-09 DIAGNOSIS — E860 Dehydration: Secondary | ICD-10-CM | POA: Diagnosis present

## 2015-03-09 DIAGNOSIS — R0902 Hypoxemia: Secondary | ICD-10-CM

## 2015-03-09 DIAGNOSIS — H409 Unspecified glaucoma: Secondary | ICD-10-CM | POA: Diagnosis present

## 2015-03-09 DIAGNOSIS — K921 Melena: Principal | ICD-10-CM | POA: Diagnosis present

## 2015-03-09 DIAGNOSIS — Z8249 Family history of ischemic heart disease and other diseases of the circulatory system: Secondary | ICD-10-CM | POA: Diagnosis not present

## 2015-03-09 LAB — CBC
HCT: 19 % — ABNORMAL LOW (ref 40.0–52.0)
Hemoglobin: 6.2 g/dL — ABNORMAL LOW (ref 13.0–18.0)
MCH: 37.8 pg — ABNORMAL HIGH (ref 26.0–34.0)
MCHC: 32.8 g/dL (ref 32.0–36.0)
MCV: 115.4 fL — AB (ref 80.0–100.0)
PLATELETS: 206 10*3/uL (ref 150–440)
RBC: 1.65 MIL/uL — AB (ref 4.40–5.90)
RDW: 16.4 % — ABNORMAL HIGH (ref 11.5–14.5)
WBC: 9.2 10*3/uL (ref 3.8–10.6)

## 2015-03-09 LAB — COMPREHENSIVE METABOLIC PANEL
ALBUMIN: 2.4 g/dL — AB (ref 3.5–5.0)
ALT: 12 U/L — AB (ref 17–63)
AST: 20 U/L (ref 15–41)
Alkaline Phosphatase: 48 U/L (ref 38–126)
Anion gap: 9 (ref 5–15)
BUN: 51 mg/dL — AB (ref 6–20)
CHLORIDE: 112 mmol/L — AB (ref 101–111)
CO2: 23 mmol/L (ref 22–32)
CREATININE: 1.83 mg/dL — AB (ref 0.61–1.24)
Calcium: 8.9 mg/dL (ref 8.9–10.3)
GFR calc Af Amer: 35 mL/min — ABNORMAL LOW (ref >60)
GFR calc non Af Amer: 30 mL/min — ABNORMAL LOW (ref >60)
Glucose, Bld: 163 mg/dL — ABNORMAL HIGH (ref 65–99)
POTASSIUM: 3.4 mmol/L — AB (ref 3.5–5.1)
SODIUM: 144 mmol/L (ref 135–145)
Total Bilirubin: 0.8 mg/dL (ref 0.3–1.2)
Total Protein: 6.4 g/dL — ABNORMAL LOW (ref 6.5–8.1)

## 2015-03-09 LAB — BLOOD GAS, VENOUS
ACID-BASE EXCESS: 0.7 mmol/L (ref 0.0–3.0)
BICARBONATE: 25.4 meq/L (ref 21.0–28.0)
DELIVERY SYSTEMS: POSITIVE
FIO2: 0.6
PCO2 VEN: 40 mmHg — AB (ref 44.0–60.0)
Patient temperature: 37
pH, Ven: 7.41 (ref 7.320–7.430)

## 2015-03-09 LAB — LACTIC ACID, PLASMA
Lactic Acid, Venous: 2.7 mmol/L (ref 0.5–2.0)
Lactic Acid, Venous: 2.4 mmol/L (ref 0.5–2.0)

## 2015-03-09 LAB — HEMOGLOBIN A1C: Hgb A1c MFr Bld: 5.3 % (ref 4.0–6.0)

## 2015-03-09 LAB — TSH: TSH: 2.759 u[IU]/mL (ref 0.350–4.500)

## 2015-03-09 LAB — PREPARE RBC (CROSSMATCH)

## 2015-03-09 LAB — ABO/RH: ABO/RH(D): B POS

## 2015-03-09 LAB — TROPONIN I: Troponin I: <0.03 ng/mL (ref <0.031)

## 2015-03-09 LAB — MRSA PCR SCREENING: MRSA by PCR: NEGATIVE

## 2015-03-09 LAB — HEMOGLOBIN: HEMOGLOBIN: 7.9 g/dL — AB (ref 13.0–18.0)

## 2015-03-09 MED ORDER — LATANOPROST 0.005 % OP SOLN
1.0000 [drp] | Freq: Every day | OPHTHALMIC | Status: DC
  Administered 2015-03-09: 1 [drp] via OPHTHALMIC
  Filled 2015-03-09: qty 2.5

## 2015-03-09 MED ORDER — MORPHINE SULFATE (PF) 2 MG/ML IV SOLN
2.0000 mg | Freq: Once | INTRAVENOUS | Status: AC
  Administered 2015-03-09: 2 mg via INTRAVENOUS
  Filled 2015-03-09: qty 1

## 2015-03-09 MED ORDER — METHOTREXATE 2.5 MG PO TABS: 10.0000 mg | ORAL_TABLET | ORAL | Status: DC

## 2015-03-09 MED ORDER — ALPRAZOLAM 0.25 MG PO TABS: 0.2500 mg | ORAL_TABLET | Freq: Four times a day (QID) | ORAL | Status: DC | PRN

## 2015-03-09 MED ORDER — SODIUM CHLORIDE 0.9 % IV SOLN
10.0000 mL/h | Freq: Once | INTRAVENOUS | Status: AC
  Administered 2015-03-09: 10 mL/h via INTRAVENOUS

## 2015-03-09 MED ORDER — PREDNISONE 50 MG PO TABS
75.0000 mg | ORAL_TABLET | Freq: Every day | ORAL | Status: DC
  Filled 2015-03-09: qty 1

## 2015-03-09 MED ORDER — SODIUM CHLORIDE 0.9 % IV SOLN
8.0000 mg/h | INTRAVENOUS | Status: DC
  Administered 2015-03-09 – 2015-03-10 (×3): 8 mg/h via INTRAVENOUS
  Filled 2015-03-09 (×4): qty 80

## 2015-03-09 MED ORDER — ALENDRONATE SODIUM 70 MG PO TABS: 70.0000 mg | ORAL_TABLET | ORAL | Status: DC

## 2015-03-09 MED ORDER — POTASSIUM CHLORIDE IN NACL 40-0.9 MEQ/L-% IV SOLN
INTRAVENOUS | Status: DC
  Administered 2015-03-09: 100 mL/h via INTRAVENOUS
  Filled 2015-03-09 (×2): qty 1000

## 2015-03-09 MED ORDER — FLUTICASONE PROPIONATE 50 MCG/ACT NA SUSP
2.0000 | Freq: Every day | NASAL | Status: DC
  Administered 2015-03-10: 2 via NASAL
  Filled 2015-03-09: qty 16

## 2015-03-09 MED ORDER — PANTOPRAZOLE SODIUM 40 MG PO TBEC: 40.0000 mg | DELAYED_RELEASE_TABLET | Freq: Every day | ORAL | Status: DC

## 2015-03-09 MED ORDER — CYANOCOBALAMIN 500 MCG PO TABS
1000.0000 ug | ORAL_TABLET | Freq: Every day | ORAL | Status: DC
Start: 2015-03-09 — End: 2015-03-10
  Filled 2015-03-09: qty 2

## 2015-03-09 MED ORDER — POTASSIUM CHLORIDE 20 MEQ/15ML (10%) PO SOLN
20.0000 meq | Freq: Once | ORAL | Status: AC
  Administered 2015-03-09: 20 meq via ORAL
  Filled 2015-03-09: qty 15

## 2015-03-09 MED ORDER — LORAZEPAM 2 MG/ML IJ SOLN
0.5000 mg | Freq: Four times a day (QID) | INTRAMUSCULAR | Status: DC | PRN
  Administered 2015-03-09 – 2015-03-10 (×2): 0.5 mg via INTRAVENOUS
  Filled 2015-03-09 (×2): qty 1

## 2015-03-09 MED ORDER — LEVOFLOXACIN IN D5W 750 MG/150ML IV SOLN
750.0000 mg | Freq: Once | INTRAVENOUS | Status: AC
  Administered 2015-03-09: 750 mg via INTRAVENOUS

## 2015-03-09 MED ORDER — HALOPERIDOL LACTATE 5 MG/ML IJ SOLN
INTRAMUSCULAR | Status: AC
  Administered 2015-03-09: 2 mg via INTRAVENOUS
  Filled 2015-03-09: qty 1

## 2015-03-09 MED ORDER — ONDANSETRON HCL 4 MG PO TABS: 4.0000 mg | ORAL_TABLET | Freq: Four times a day (QID) | ORAL | Status: DC | PRN

## 2015-03-09 MED ORDER — PAROXETINE HCL 20 MG PO TABS
20.0000 mg | ORAL_TABLET | Freq: Every day | ORAL | Status: DC
  Filled 2015-03-09: qty 1

## 2015-03-09 MED ORDER — GUAIFENESIN 100 MG/5ML PO SOLN: 200.0000 mg | ORAL | Status: DC | PRN

## 2015-03-09 MED ORDER — SODIUM CHLORIDE 0.9 % IV BOLUS (SEPSIS)
1000.0000 mL | Freq: Once | INTRAVENOUS | Status: AC
  Administered 2015-03-09: 1000 mL via INTRAVENOUS

## 2015-03-09 MED ORDER — HALOPERIDOL LACTATE 5 MG/ML IJ SOLN
2.0000 mg | Freq: Once | INTRAMUSCULAR | Status: AC
  Administered 2015-03-09: 2 mg via INTRAVENOUS

## 2015-03-09 MED ORDER — FUROSEMIDE 20 MG PO TABS: 20.0000 mg | ORAL_TABLET | Freq: Every day | ORAL | Status: DC

## 2015-03-09 MED ORDER — ACETAMINOPHEN 325 MG PO TABS: 650.0000 mg | ORAL_TABLET | ORAL | Status: DC | PRN

## 2015-03-09 MED ORDER — VITAMIN D 1000 UNITS PO TABS
2000.0000 [IU] | ORAL_TABLET | Freq: Every day | ORAL | Status: DC
  Filled 2015-03-09: qty 2

## 2015-03-09 MED ORDER — ACETAMINOPHEN 10 MG/ML IV SOLN
1000.0000 mg | Freq: Once | INTRAVENOUS | Status: AC
  Administered 2015-03-09: 1000 mg via INTRAVENOUS
  Filled 2015-03-09: qty 100

## 2015-03-09 MED ORDER — CHOLECALCIFEROL 125 MCG (5000 UT) PO CAPS: 5000.0000 [IU] | ORAL_CAPSULE | ORAL | Status: DC

## 2015-03-09 MED ORDER — MONTELUKAST SODIUM 10 MG PO TABS
10.0000 mg | ORAL_TABLET | Freq: Every day | ORAL | Status: DC
Start: 2015-03-09 — End: 2015-03-10
  Filled 2015-03-09: qty 1

## 2015-03-09 MED ORDER — FERROUS SULFATE 325 (65 FE) MG PO TABS
325.0000 mg | ORAL_TABLET | Freq: Every day | ORAL | Status: DC
  Filled 2015-03-09: qty 1

## 2015-03-09 MED ORDER — SODIUM CHLORIDE 0.9 % IV SOLN
80.0000 mg | Freq: Once | INTRAVENOUS | Status: AC
  Administered 2015-03-09: 80 mg via INTRAVENOUS
  Filled 2015-03-09: qty 80

## 2015-03-09 MED ORDER — FINASTERIDE 5 MG PO TABS
5.0000 mg | ORAL_TABLET | Freq: Every day | ORAL | Status: DC
  Filled 2015-03-09: qty 1

## 2015-03-09 MED ORDER — PIPERACILLIN-TAZOBACTAM 3.375 G IVPB
3.3750 g | Freq: Once | INTRAVENOUS | Status: AC
  Administered 2015-03-09: 3.375 g via INTRAVENOUS

## 2015-03-09 MED ORDER — PANTOPRAZOLE SODIUM 40 MG IV SOLR: 40.0000 mg | Freq: Two times a day (BID) | INTRAVENOUS | Status: DC

## 2015-03-09 MED ORDER — ADULT MULTIVITAMIN W/MINERALS CH
1.0000 | ORAL_TABLET | Freq: Every day | ORAL | Status: DC
Start: 2015-03-09 — End: 2015-03-10
  Filled 2015-03-09: qty 1

## 2015-03-09 MED ORDER — SODIUM CHLORIDE 0.9 % IJ SOLN
3.0000 mL | Freq: Two times a day (BID) | INTRAMUSCULAR | Status: DC
  Administered 2015-03-09 – 2015-03-10 (×2): 3 mL via INTRAVENOUS

## 2015-03-09 MED ORDER — ONDANSETRON HCL 4 MG/2ML IJ SOLN: 4.0000 mg | Freq: Four times a day (QID) | INTRAMUSCULAR | Status: DC | PRN

## 2015-03-09 MED ORDER — POTASSIUM CHLORIDE ER 10 MEQ PO TBCR
10.0000 meq | EXTENDED_RELEASE_TABLET | Freq: Every day | ORAL | Status: DC
  Filled 2015-03-09 (×3): qty 1

## 2015-03-09 MED ORDER — MORPHINE SULFATE (PF) 2 MG/ML IV SOLN
INTRAVENOUS | Status: AC
  Administered 2015-03-09: 1 mg via INTRAVENOUS
  Filled 2015-03-09: qty 1

## 2015-03-09 MED ORDER — DOCUSATE SODIUM 100 MG PO CAPS: 100.0000 mg | ORAL_CAPSULE | Freq: Two times a day (BID) | ORAL | Status: DC | PRN

## 2015-03-09 MED ORDER — ALUM & MAG HYDROXIDE-SIMETH 200-200-20 MG/5ML PO SUSP: 30.0000 mL | ORAL | Status: DC | PRN

## 2015-03-09 MED ORDER — MORPHINE SULFATE (PF) 2 MG/ML IV SOLN
1.0000 mg | Freq: Once | INTRAVENOUS | Status: AC
  Administered 2015-03-09: 1 mg via INTRAVENOUS

## 2015-03-09 MED ORDER — LEVOFLOXACIN IN D5W 750 MG/150ML IV SOLN: 750.0000 mg | INTRAVENOUS | Status: DC

## 2015-03-09 MED ORDER — LEVOTHYROXINE SODIUM 137 MCG PO TABS
137.0000 ug | ORAL_TABLET | Freq: Every day | ORAL | Status: DC
  Filled 2015-03-09: qty 1

## 2015-03-09 MED ORDER — FOLIC ACID 1 MG PO TABS
3.0000 mg | ORAL_TABLET | Freq: Every day | ORAL | Status: DC
Start: 2015-03-09 — End: 2015-03-10
  Filled 2015-03-09: qty 3

## 2015-03-09 MED ORDER — BRIMONIDINE TARTRATE 0.2 % OP SOLN
1.0000 [drp] | Freq: Two times a day (BID) | OPHTHALMIC | Status: DC
  Administered 2015-03-09 – 2015-03-10 (×2): 1 [drp] via OPHTHALMIC
  Filled 2015-03-09: qty 5

## 2015-03-09 MED ORDER — POLYVINYL ALCOHOL 1.4 % OP SOLN
1.0000 [drp] | Freq: Two times a day (BID) | OPHTHALMIC | Status: DC | PRN
  Filled 2015-03-09: qty 15

## 2015-03-09 MED ORDER — MAGNESIUM HYDROXIDE 400 MG/5ML PO SUSP: 30.0000 mL | Freq: Every day | ORAL | Status: DC | PRN

## 2015-03-09 MED ORDER — IPRATROPIUM-ALBUTEROL 0.5-2.5 (3) MG/3ML IN SOLN: 3.0000 mL | RESPIRATORY_TRACT | Status: DC | PRN

## 2015-03-09 MED ORDER — MORPHINE SULFATE (PF) 2 MG/ML IV SOLN
1.0000 mg | INTRAVENOUS | Status: DC | PRN
  Administered 2015-03-09: 1 mg via INTRAVENOUS
  Filled 2015-03-09: qty 1

## 2015-03-09 MED ORDER — POTASSIUM CHLORIDE 20 MEQ/15ML (10%) PO SOLN: 40.0000 meq | Freq: Once | ORAL | Status: DC

## 2015-03-09 MED ORDER — POLYVINYL ALCOHOL 1.4 % OP SOLN
1.0000 [drp] | Freq: Two times a day (BID) | OPHTHALMIC | Status: DC
  Administered 2015-03-09 – 2015-03-10 (×2): 1 [drp] via OPHTHALMIC
  Filled 2015-03-09: qty 15

## 2015-03-09 NOTE — ED Notes (Signed)
Blood consent signed by daughter. Consent placed on chart.

## 2015-03-09 NOTE — ED Notes (Signed)
Patient appears much more comfortable after the previously administered medications. Patient has brief periods where he will awaken and seem startled, however he will quickly fall back to sleep. No further yelling noted at this time. Patient awaiting bed assignment on inpatient unit for admission.

## 2015-03-09 NOTE — ED Notes (Signed)
Bair hugger removed; patient pulling blanket off, however is unable to verbalized whether or not he is hot. Axillary temp 99.1. Will follow up on temperature with next VS check during blood transfusion.

## 2015-03-09 NOTE — Progress Notes (Signed)
Speech Therapy Note: received order, reviewed chart notes, consulted NSG who reported pt's alert status was quite declined at this time. Pt is moaning and being given something for pain per NSG. Pt was recently admitted to this facility per chart notes. He is currently NPO d/t decreased alertness and concern for aspiration. ST rec. Continued NPO status at this time d/t increased risk for aspiration; oral care. ST will f/u w/ pt's status tomorrow. NSG agreed.

## 2015-03-09 NOTE — ED Notes (Signed)
Patient anxious and agitated AEB frequent yelling out and moving about in bed. Daughter at bedside and advises that patient has chronic back pain and normally sleeps in a recliner. MD with VORB for Morphine 1mg  IVP and Haldol 2mg  IVP. Orders to be entered and carried by this RN.

## 2015-03-09 NOTE — ED Notes (Signed)
Dr. Marcille Blanco in to see and assess patient for admission to the hospital.

## 2015-03-09 NOTE — Consult Note (Signed)
  Pt seen and examined. Please see Ronney Asters, PA notes. Pt with probable UGI bleeding and melena. Anemic. Getting blood transfusion. According to daughter, clinically improving. Pt not able to respond due to severe dementia. Had pneumonia. Has pulmonary fibrosis. Unclear to family why he is prednisone and MTX. Possibly due to arthritis or COPD exacerbation? Anyway, prednisone can cause stress ulcers. Would continue to moniter hgb and transfuse as needed. Family does not want to do any aggressive interventions, i.e. EGD, at this time. Would start clears and advance as tolerated. Will follow. thanks

## 2015-03-09 NOTE — Consult Note (Signed)
GI Inpatient Consult Note  Reason for Consult: GI Bleed    Attending Requesting Consult: Harrie Foreman, MD  History of Present Illness: Aaron Cook is a 81 y.o. male seen for evaluation of GI Bleed at the request of Dr. Marcille Blanco.   Recently admitted on 02/28/15 to 03/04/15 for fever and weakness, found to have sepsis secondary to bilateral pneumonia. Discharged to [facility] on Augmentin. Presented to ER last night for respiratory distress. Hypotensive on arrival. Hgb found to be 6.2 (down from 8.1 at discharge 5 days ago), elevated lactic acid at 2.4, elevated Cr at 1.8.   Unable to obtain any history from patient - all history obtained from daughter at bedside. Daughter reports that he was improving after last discharge until about 48 hours ago when developed worsening mental status. He had no specific complaints. Tolerating most of diet at [facility]. States pt became more disorientated and had SOB which prompted admission. She believes he has been on aspirin intermittently in past 3 months but no other NSAIDS. No chronic GI complaints except GERD which is well controlled w/ omeprazole 20mg  qd. She denies any history of GI bleed - reports did have significant anemia requiring multiple transfusions 3 years ago, no GI work up, felt related to bone marrow.   Last Colonoscopy:  Per daughter last around age 71-80, normal.  Last Endoscopy: None prior per daughter.    Past Medical History:  Past Medical History  Diagnosis Date  . Diabetes mellitus ([place])   . Incontinence   . Over weight   . BPH (benign prostatic hyperplasia)   . CHF (congestive heart failure) ([place])   . Pneumonia   . Lung mass   . CAD (coronary artery disease)   . A-fib ([place])   . HTN (hypertension)   . Anemia, chronic disease   . Hyperlipemia   . Hypothyroid   . Depression   . Prostate carcinoma ([place])   . Glaucoma     Problem List: Patient Active Problem List   Diagnosis Date Noted  . GI bleed  03/09/2015  . Pressure ulcer 03/01/2015  . HCAP (healthcare-associated pneumonia) 02/28/2015    Past Surgical History: Past Surgical History  Procedure Laterality Date  . Penile prosthesis placement    . Penile prosthesis  removal    . Shoulder replacement    . Replacement total knee      Allergies: Allergies  Allergen Reactions  . Atropine Other (See Comments)    Reaction: unknown  . Scopolamine Other (See Comments)    Reaction: unknown    Home Medications: Prescriptions prior to admission  Medication Sig Dispense Refill Last Dose  . acetaminophen (TYLENOL) 325 MG tablet Take 650 mg by mouth every 4 (four) hours as needed for moderate pain or fever.    03/08/2015 at Unknown time  . alendronate (FOSAMAX) 70 MG tablet Take 70 mg by mouth once a week. Take with a full glass of water on an empty stomach.   03/08/2015 at Unknown time  . ALPRAZolam (NIRAVAM) 0.25 MG dissolvable tablet Take 0.25 mg by mouth every 6 (six) hours as needed for anxiety.   03/08/2015 at Unknown time  . alum & mag hydroxide-simeth (MYLANTA) [id number] MG/5ML suspension Take 30 mLs by mouth every 4 (four) hours as needed for indigestion (upset stomach).   03/08/2015 at Unknown time  . amoxicillin-clavulanate (AUGMENTIN) 875-125 MG tablet Take 1 tablet by mouth 2 (two) times daily. 14 tablet 0 03/08/2015 at Unknown time  .  brimonidine (ALPHAGAN) 0.2 % ophthalmic solution Place 1 drop into the right eye 2 (two) times daily.    03/08/2015 at Unknown time  . Calcium-Magnesium-Vitamin D (CALCIUM 500 PO) Take 1 tablet by mouth daily.   03/08/2015 at Unknown time  . Cholecalciferol 5000 UNITS capsule Take 5,000 Units by mouth once a week.   03/08/2015 at Unknown time  . docusate sodium (COLACE) 100 MG capsule Take 100 mg by mouth 2 (two) times daily as needed for mild constipation.    03/08/2015 at Unknown time  . Ergocalciferol 2000 UNITS TABS Take 1 tablet by mouth daily.   03/08/2015 at Unknown time  . ferrous  sulfate 325 (65 FE) MG tablet Take 325 mg by mouth daily with breakfast.   03/08/2015 at Unknown time  . finasteride (PROSCAR) 5 MG tablet Take 5 mg by mouth daily.   03/08/2015 at Unknown time  . fluticasone (FLONASE) 50 MCG/ACT nasal spray Place 2 sprays into both nostrils daily.    03/08/2015 at Unknown time  . folic acid (FOLVITE) 1 MG tablet Take 3 mg by mouth daily.    03/08/2015 at Unknown time  . furosemide (LASIX) 20 MG tablet Take 20 mg by mouth.   03/08/2015 at Unknown time  . guaiFENesin (ROBITUSSIN) 100 MG/5ML liquid Take 200 mg by mouth every 4 (four) hours as needed for cough.   03/08/2015 at Unknown time  . hydroxypropyl methylcellulose / hypromellose (ISOPTO TEARS / GONIOVISC) 2.5 % ophthalmic solution Place 1 drop into both eyes 2 (two) times daily as needed for dry eyes (itchy eyes).   03/08/2015 at Unknown time  . ipratropium-albuterol (DUONEB) 0.5-2.5 (3) MG/3ML SOLN Take 3 mLs by nebulization every 4 (four) hours as needed (cough/ wheezing).   03/08/2015 at Unknown time  . latanoprost (XALATAN) 0.005 % ophthalmic solution Place 1 drop into the right eye at bedtime.    03/08/2015 at Unknown time  . levothyroxine (SYNTHROID, LEVOTHROID) 137 MCG tablet Take 137 mcg by mouth daily before breakfast.   03/08/2015 at Unknown time  . magnesium hydroxide (MILK OF MAGNESIA) 400 MG/5ML suspension Take 30 mLs by mouth daily as needed for mild constipation.    03/08/2015 at Unknown time  . methotrexate (RHEUMATREX) 2.5 MG tablet Take 10 mg by mouth once a week. Caution:Chemotherapy. Protect from light.   03/08/2015 at Unknown time  . montelukast (SINGULAIR) 10 MG tablet Take 10 mg by mouth daily.    03/08/2015 at Unknown time  . omeprazole (PRILOSEC) 20 MG capsule Take 20 mg by mouth daily.   03/08/2015 at Unknown time  . PARoxetine (PAXIL) 20 MG tablet Take 20 mg by mouth daily.   03/08/2015 at Unknown time  . potassium chloride (K-DUR) 10 MEQ tablet Take 1 tablet by mouth daily.    03/08/2015 at Unknown time  . predniSONE (DELTASONE) 5 MG tablet Take 15 tablets by mouth daily with breakfast.    03/08/2015 at Unknown time  . Propylene Glycol (SYSTANE BALANCE) 0.6 % SOLN Place 1 drop into the left eye 2 (two) times daily.    03/08/2015 at Unknown time  . vitamin B-12 (CYANOCOBALAMIN) 500 MCG tablet Take 1,000 mcg by mouth daily.    03/08/2015 at Unknown time   Home medication reconciliation was completed with the patient.   Scheduled Inpatient Medications:   . brimonidine  1 drop Right Eye BID  . cholecalciferol  2,000 Units Oral Daily  . cyanocobalamin  1,000 mcg Oral Daily  . ferrous sulfate  325 mg  Oral Q breakfast  . finasteride  5 mg Oral Daily  . fluticasone  2 spray Each Nare Daily  . folic acid  3 mg Oral Daily  . furosemide  20 mg Oral Daily  . latanoprost  1 drop Right Eye QHS  . [START ON 02/23/2015] levofloxacin (LEVAQUIN) IV  750 mg Intravenous Q48H  . levothyroxine  137 mcg Oral QAC breakfast  . [START ON 03/15/2015] methotrexate  10 mg Oral Weekly  . montelukast  10 mg Oral Daily  . multivitamin with minerals  1 tablet Oral Daily  . [START ON 03/12/2015] pantoprazole (PROTONIX) IV  40 mg Intravenous Q12H  . PARoxetine  20 mg Oral Daily  . polyvinyl alcohol  1 drop Left Eye BID  . potassium chloride  10 mEq Oral Daily  . predniSONE  75 mg Oral Q breakfast  . sodium chloride  3 mL Intravenous Q12H    Continuous Inpatient Infusions:   . 0.9 % NaCl with KCl 40 mEq / L 100 mL/hr (03/09/15 [id number])  . pantoprozole (PROTONIX) infusion 8 mg/hr (03/09/15 0547)    PRN Inpatient Medications:  acetaminophen, ALPRAZolam, alum & mag hydroxide-simeth, docusate sodium, guaiFENesin, ipratropium-albuterol, magnesium hydroxide, morphine injection, ondansetron **OR** ondansetron (ZOFRAN) IV, polyvinyl alcohol  Family History: family history includes Heart disease in his brother and father.  The patient's family history is negative for inflammatory bowel  disorders, GI malignancy, or solid organ transplantation.  Social History:   reports that he has quit smoking. He does not have any smokeless tobacco history on file. He reports that he does not drink alcohol. The patient denies ETOH, tobacco, or drug use.   Review of Systems: (obtained from daughter given patient's mental status)  Constitutional:  Down #7lbs this month per daughter Eyes: No changes in vision. ENT: No oral lesions, sore throat.  GI: see HPI.  Heme/Lymph: + easy bruising.  CV: No chest pain.  GU: No hematuria.  Integumentary: No rashes.  Neuro: No headaches.  Psych: No depression/anxiety.  Endocrine: No heat/cold intolerance.  Allergic/Immunologic: No urticaria.  Resp: + cough, SOB.  Musculoskeletal: No joint swelling.    Physical Examination: BP 100/46 mmHg  Pulse 108  Temp(Src) 98.8 F (37.1 C) (Oral)  Resp 16  Ht 5\' 3"  (1.6 m)  Wt 162 lb 9.6 oz (73.755 kg)  BMI 28.81 kg/m2  SpO2 96% Gen: sedated, moans at times, not orientated to person, place or time.  HEENT: PEERLA, EOMI, Neck: supple, no JVD or thyromegaly Chest: + rhonchi bilaterally  CV: RRR, 2+ systolic murmur Abd: soft, NT, ND, +BS in all four quadrants; no HSM, guarding, ridigity, or rebound tenderness Rectal exam: dark brown to black stool in rectal vault without obvious BRB or maroon blood.  Ext: no edema, well perfused with 2+ pulses, Skin: multiple skin tears  Lymph: no LAD  Data: Lab Results  Component Value Date   WBC 9.2 03/09/2015   HGB 6.2* 03/09/2015   HCT 19.0* 03/09/2015   MCV 115.4* 03/09/2015   PLT 206 03/09/2015    Recent Labs Lab 03/03/15 0655 03/04/15 0438 03/09/15 0209  HGB 7.4* 8.1* 6.2*   Lab Results  Component Value Date   NA 144 03/09/2015   K 3.4* 03/09/2015   CL 112* 03/09/2015   CO2 23 03/09/2015   BUN 51* 03/09/2015   CREATININE 1.83* 03/09/2015   Lab Results  Component Value Date   ALT 12* 03/09/2015   AST 20 03/09/2015   ALKPHOS 48  03/09/2015  BILITOT 0.8 03/09/2015   No results for input(s): APTT, INR, PTT in the last 168 hours. Assessment/Plan:  Mr. Scroggin is a 81 y.o. male seen for evaluation of GI Bleed:   1. Anemia/melena. No NSAIDS on nursing facility list but daughter recalls recently being on aspirin and is on high dose prednisone (75mg  qd) increasing risk for ulcer disease. Elevated BUN/Cr ratio with melena suggests UGI bleed. Differential includes PUD, erosive gastritis, AVM, malignancy. Patient is high risk for endoscopic procedures given his advanced age and cardiac/respiratory co-morbidities. Repeat H/H after 2 units improved 6.2-->7.9. Continue PPI gtt, monitoring H/H, and NPO until decision regarding procedures.     Recommendations:  Further recommendations pending Dr. Myrna Blazer discussion with patient's daughter (POA). Given Hgb improved after transfusion they may favor monitoring/supportive care versus upper endoscopy which is reasonable.    Case discussed with Dr. Candace Cruise. Thank you for the consult. Please call with questions or concerns.  Ronney Asters, PA-C [place]

## 2015-03-09 NOTE — ED Provider Notes (Signed)
[facility] Emergency Department Provider Note  ____________________________________________  Time seen: Approximately 132 AM  I have reviewed the triage vital signs and the nursing notes.   HISTORY  Chief Complaint Shortness of Breath and GI Bleeding  Patient with minimal responsiveness and respiratory distress  HPI Aaron Cook is a 81 y.o. male who was recently admitted and discharged from the hospital with pneumonia. According to EMS the nursing home noticed the patient unresponsive at shift change around 11 PM. They report that he was breathing difficult and had room air oxygen saturation in the 50s. They tried to give the patient a breathing treatment but he did not improve so they decided to send him into the hospital. The patient is currently under treatment for pneumonia. EMS also reports that the nursing home noted some black and tarry stool at some point today but they're unsure exactly when. The patient is struggling to breathe and is unable to participate in the history.   Past Medical History  Diagnosis Date  . Diabetes mellitus ([place])   . Incontinence   . Over weight   . BPH (benign prostatic hyperplasia)   . CHF (congestive heart failure) ([place])   . Pneumonia   . Lung mass   . CAD (coronary artery disease)   . A-fib ([place])   . HTN (hypertension)   . Anemia, chronic disease   . Hyperlipemia   . Hypothyroid   . Depression   . Prostate carcinoma ([place])   . Glaucoma     Patient Active Problem List   Diagnosis Date Noted  . GI bleed 03/09/2015  . Pressure ulcer 03/01/2015  . HCAP (healthcare-associated pneumonia) 02/28/2015    Past Surgical History  Procedure Laterality Date  . Penile prosthesis placement    . Penile prosthesis  removal    . Shoulder replacement    . Replacement total knee      No current outpatient prescriptions on file.  Allergies Atropine and Scopolamine  Family History  Problem Relation Age of Onset   . Heart disease Brother   . Heart disease Father     Social History Social History  Substance Use Topics  . Smoking status: Former Research scientist (life sciences)  . Smokeless tobacco: None  . Alcohol Use: No    Review of Systems  Unable to assess review of systems as patient with minimal responsiveness and severe respiratory distress  ____________________________________________   PHYSICAL EXAM:  VITAL SIGNS: ED Triage Vitals  Enc Vitals Group     BP 03/09/15 0215 92/61 mmHg     Pulse Rate 03/09/15 0159 118     Resp 03/09/15 0215 27     Temp 03/09/15 0310 96.1 F (35.6 C)     Temp Source 03/09/15 0310 Rectal     SpO2 03/09/15 0159 100 %     Weight 03/09/15 0158 167 lb 1.6 oz (75.796 kg)     Height --      Head Cir --      Peak Flow --      Pain Score --      Pain Loc --      Pain Edu? --      Excl. in [place]? --     Constitutional: Somnolent but intermittently follows commands in severe respiratory distress Eyes: Conjunctivae are normal. PERRL. EOMI. Head: Atraumatic. Nose: No congestion/rhinnorhea. Mouth/Throat: Mucous membranes are dry.  Oropharynx non-erythematous. Cardiovascular: Tachycardia regular rhythm. Grossly normal heart sounds.  Good peripheral circulation. Respiratory: Increased respiratory effort.  Coarse rhonchi throughout all lung fields, decreased air movement. Gastrointestinal: Soft and nontender. No distention. No abdominal bruits. No CVA tenderness. Rectal: Black tarry stool noted from patient's rectum guaiac positive control positive Musculoskeletal: No lower extremity tenderness nor edema.  No joint effusions. Neurologic:  Patient not speaking at this time. Does pick up his head as if to look around does squeeze my hands bilaterally and has good strength in his upper extremities. Does not fully follow commands. Skin:  Can is cool to touch with multiple ecchymosis on arms. Psychiatric: Patient with minimal responsiveness but does follow some commands by squeezing my  hands.  ____________________________________________   LABS (all labs ordered are listed, but only abnormal results are displayed)  Labs Reviewed  CBC - Abnormal; Notable for the following:    RBC 1.65 (*)    Hemoglobin 6.2 (*)    HCT 19.0 (*)    MCV 115.4 (*)    MCH 37.8 (*)    RDW 16.4 (*)    All other components within normal limits  COMPREHENSIVE METABOLIC PANEL - Abnormal; Notable for the following:    Potassium 3.4 (*)    Chloride 112 (*)    Glucose, Bld 163 (*)    BUN 51 (*)    Creatinine, Ser 1.83 (*)    Total Protein 6.4 (*)    Albumin 2.4 (*)    ALT 12 (*)    GFR calc non Af Amer 30 (*)    GFR calc Af Amer 35 (*)    All other components within normal limits  LACTIC ACID, PLASMA - Abnormal; Notable for the following:    Lactic Acid, Venous 2.7 (*)    All other components within normal limits  LACTIC ACID, PLASMA - Abnormal; Notable for the following:    Lactic Acid, Venous 2.4 (*)    All other components within normal limits  BLOOD GAS, VENOUS - Abnormal; Notable for the following:    pCO2, Ven 40 (*)    All other components within normal limits  CULTURE, BLOOD (ROUTINE X 2)  CULTURE, BLOOD (ROUTINE X 2)  MRSA PCR SCREENING  TROPONIN I  TSH  HEMOGLOBIN A1C  PREPARE RBC (CROSSMATCH)  TYPE AND SCREEN  ABO/RH   ____________________________________________  EKG  ED ECG REPORT I, Loney Hering, the attending physician, personally viewed and interpreted this ECG.   Date: 03/09/2015  EKG Time: 142  Rate: 118  Rhythm: sinus tachycardia  Axis: Normal  Intervals:none  ST&T Change: none  ____________________________________________  RADIOLOGY  Chest x-ray: Bilateral peripheral opacities are thought reflect worsening pulmonary fibrosis, though superimposed pneumonia cannot be excluded given the patient's symptoms. ____________________________________________   PROCEDURES  Procedure(s) performed: None  Critical Care performed: Yes, see  critical care note(s)  CRITICAL CARE Performed by: Charlesetta Ivory P   Total critical care time: 45 minutes  Critical care time was exclusive of separately billable procedures and treating other patients.  Critical care was necessary to treat or prevent imminent or life-threatening deterioration.  Critical care was time spent personally by me on the following activities: development of treatment plan with patient and/or surrogate as well as nursing, discussions with consultants, evaluation of patient's response to treatment, examination of patient, obtaining history from patient or surrogate, ordering and performing treatments and interventions, ordering and review of laboratory studies, ordering and review of radiographic studies, pulse oximetry and re-evaluation of patient's condition.  ____________________________________________   INITIAL IMPRESSION / ASSESSMENT AND PLAN / ED COURSE  Pertinent  labs & imaging results that were available during my care of the patient were reviewed by me and considered in my medical decision making (see chart for details).  When the patient arrived to the emergency department he had his head slumped back with very sonorous respirations. The patient is a DO NOT RESUSCITATE so he is not a candidate for intubation but given his sonorous respirations we did place the patient on BiPAP. The patient was given a liter of normal saline when he initially arrived with a concern that he may have some sepsis. The patient has been admitted to the hospital with pneumonia previously. I was called back in and noticed a black tarry stool which made me concerned about a GI bleed. The patient's hemoglobin has decreased in the last few days since he was admitted here and he also has some acute renal insufficiency and lactic acidosis. Although the patient has had pneumonia and is mildly hypothermic I feel that some of his tachycardia and blood pressure may be related to acute  blood loss from his severe GI bleed. I gave the patient 2 L of normal saline as well as a Protonix drip. I discussed the patient with the family was unable to determine his outcome at this time. I did order some blood to be given to the patient as he has some borderline hypotension. The patient will be admitted to the hospitalist service for further evaluation and treatment. ____________________________________________   FINAL CLINICAL IMPRESSION(S) / ED DIAGNOSES  Final diagnoses:  Respiratory distress  Hypoxia  Healthcare-associated pneumonia  Gastrointestinal hemorrhage associated with gastritis, unspecified gastritis  Transient alteration of awareness      Loney Hering, MD 03/09/15 [phone]

## 2015-03-09 NOTE — ED Notes (Signed)
Bair hugger applied at this time secondary to patient's low core body temp.

## 2015-03-09 NOTE — ED Notes (Signed)
Patient had large BM - noted to be black and tarry in color. Bedside hemoccult performed - sample was POSITIVE for blood. MD made aware.

## 2015-03-09 NOTE — ED Notes (Signed)
Lactic acid level of 2.7 called to this RN - result communicated to Dr. Dahlia Client.

## 2015-03-09 NOTE — Progress Notes (Signed)
Patient moaning, family informed that patient has chronic back pain. Patient BP low, spoke with Dr. Bridgett Larsson about IV tylenol. Gave telephone order for IV tylenol. Wilnette Kales

## 2015-03-09 NOTE — ED Notes (Signed)
Patient on 3L/[place] supplemental oxygen and remains at 100% at this time. Patient with no evidence of respiratory distress. RR 19 at this time. Patient receiving initial unit of PRBCs over 1 hours per MD order; no adverse reaction noted thus far. Will continue to monitor.

## 2015-03-09 NOTE — ED Notes (Signed)
Patient removed from Bi-Pap per VORB from Dr. Marcille Blanco. Patient placed on supplemental oxygen via [place] @ 3/L. Will monitor tolerance.

## 2015-03-09 NOTE — ED Notes (Signed)
Patient report called to Apolonio Schneiders, RN - patient to be transported to 2A when second unit of PRBCs hung and set to infuse per MD direction.

## 2015-03-09 NOTE — Progress Notes (Signed)
Palliative Care update- met with patient's daughter who is visiting from [place], however is not POA. Daughter Mardene Celeste who is POA is not present, has gone home to take a nap. Writer will contact Mardene Celeste this evening to schedule a family meeting to complete palliative care consult. This may be tomorrow. Informed Rita Ohara, RN care Freight forwarder of this plan. Will continue to follow.  Atha Starks, MSW, LCSW [phone] (c)

## 2015-03-09 NOTE — ED Notes (Signed)
Patient presents to ED 16 via EMS from [facility]. Patient reported to have been found in respiratory distress at 2300 shift change - staff thought that giving him a SVN would improve condition, however when patient did not improve they called EMS. Patient also reported to have been having "black tarry stools" today. Of note, patient was just discharged from hospital following an admission for pneumonia. Patient on NRB upon arrival; SPO2 92%.

## 2015-03-09 NOTE — Progress Notes (Signed)
ANTIBIOTIC CONSULT NOTE - INITIAL  Pharmacy Consult for Levaquin dosing Indication: HCAP  Allergies  Allergen Reactions  . Atropine Other (See Comments)    Reaction: unknown  . Scopolamine Other (See Comments)    Reaction: unknown    Patient Measurements: Height: 5\' 3"  (160 cm) (stated) Weight: 162 lb 9.6 oz (73.755 kg) (admission) IBW/kg (Calculated) : 56.9 Adjusted Body Weight: n/a  Vital Signs: Temp: 98.8 F (37.1 C) (11/16 0654) Temp Source: Oral (11/16 0654) BP: 100/46 mmHg (11/16 0654) Pulse Rate: 108 (11/16 0654) Intake/Output from previous day: 11/15 0701 - 11/16 0700 In: 2800 [I.V.:2550; Blood:250] Out: -  Intake/Output from this shift:    Labs:  Recent Labs  03/09/15 0209  WBC 9.2  HGB 6.2*  PLT 206  CREATININE 1.83*   Estimated Creatinine Clearance: 23.2 mL/min (by C-G formula based on Cr of 1.83). No results for input(s): VANCOTROUGH, VANCOPEAK, VANCORANDOM, GENTTROUGH, GENTPEAK, GENTRANDOM, TOBRATROUGH, TOBRAPEAK, TOBRARND, AMIKACINPEAK, AMIKACINTROU, AMIKACIN in the last 72 hours.   Microbiology: Recent Results (from the past 720 hour(s))  Blood Culture (routine x 2)     Status: None   Collection Time: 02/28/15 11:16 AM  Result Value Ref Range Status   Specimen Description BLOOD LEFT IVSITE  Final   Special Requests   Final    BOTTLES DRAWN AEROBIC AND ANAEROBIC  1CC ANAERO [place] AERO   Culture NO GROWTH 5 DAYS  Final   Report Status 03/05/2015 FINAL  Final  Blood Culture (routine x 2)     Status: None   Collection Time: 02/28/15 11:25 AM  Result Value Ref Range Status   Specimen Description BLOOD RIGHT IV  Final   Special Requests   Final    BOTTLES DRAWN AEROBIC AND ANAEROBIC  3CC ANAERO 2 CC AERO   Culture NO GROWTH 5 DAYS  Final   Report Status 03/05/2015 FINAL  Final  Urine culture     Status: None   Collection Time: 02/28/15 11:39 AM  Result Value Ref Range Status   Specimen Description URINE, RANDOM  Final   Special Requests NONE   Final   Culture NO GROWTH 2 DAYS  Final   Report Status 03/02/2015 FINAL  Final  MRSA PCR Screening     Status: None   Collection Time: 02/28/15  7:46 PM  Result Value Ref Range Status   MRSA by PCR NEGATIVE NEGATIVE Final    Comment:        The GeneXpert MRSA Assay (FDA approved for NASAL specimens only), is one component of a comprehensive MRSA colonization surveillance program. It is not intended to diagnose MRSA infection nor to guide or monitor treatment for MRSA infections.   Culture, sputum-assessment     Status: None   Collection Time: 03/03/15  2:22 PM  Result Value Ref Range Status   Specimen Description SPUTUM  Final   Special Requests Immunocompromised  Final   Sputum evaluation THIS SPECIMEN IS ACCEPTABLE FOR SPUTUM CULTURE  Final   Report Status 03/03/2015 FINAL  Final  Culture, respiratory (NON-Expectorated)     Status: None   Collection Time: 03/03/15  2:22 PM  Result Value Ref Range Status   Specimen Description SPUTUM  Final   Special Requests Immunocompromised Reflexed from [id number]  Final   Gram Stain   Final    FAIR SPECIMEN - 70-80% WBCS FEW WBC SEEN RARE GRAM NEGATIVE RODS    Culture Consistent with normal respiratory flora.  Final   Report Status 03/05/2015 FINAL  Final  Medical History: Past Medical History  Diagnosis Date  . Diabetes mellitus ([place])   . Incontinence   . Over weight   . BPH (benign prostatic hyperplasia)   . CHF (congestive heart failure) ([place])   . Pneumonia   . Lung mass   . CAD (coronary artery disease)   . A-fib ([place])   . HTN (hypertension)   . Anemia, chronic disease   . Hyperlipemia   . Hypothyroid   . Depression   . Prostate carcinoma ([place])   . Glaucoma     Medications:   Assessment: Blood cx pending CXR: bilateral opacities  Goal of Therapy:  Resolution of infection  Plan:  Levaquin 750 mg IV q 48 hours ordered.  Robbyn Hodkinson S 03/09/2015,7:11 AM

## 2015-03-09 NOTE — Progress Notes (Signed)
[facility] [place] NAME: Aaron Cook    MR#:  [id number]  DATE OF BIRTH:  03-May-1920  SUBJECTIVE:  CHIEF COMPLAINT:   Chief Complaint  Patient presents with  . Shortness of Breath  . GI Bleeding   patient is demented, on oxygen by nasal cannular.  REVIEW OF SYSTEMS:  Unable to obtain ROS.  DRUG ALLERGIES:   Allergies  Allergen Reactions  . Atropine Other (See Comments)    Reaction: unknown  . Scopolamine Other (See Comments)    Reaction: unknown    VITALS:  Blood pressure 94/30, pulse 107, temperature 98.7 F (37.1 C), temperature source Oral, resp. rate 24, height 5\' 3"  (1.6 m), weight 73.755 kg (162 lb 9.6 oz), SpO2 100 %.  PHYSICAL EXAMINATION:  GENERAL:  81 y.o.-year-old patient lying in the bed, on oxygen by nasal cannula. Chronic ill-looking. EYES: Pupils equal, round, reactive to light and accommodation. No scleral icterus. Extraocular muscles intact.  HEENT: Head atraumatic, normocephalic. Oropharynx and nasopharynx clear.  NECK:  Supple, no jugular venous distention. No thyroid enlargement, no tenderness.  LUNGS: Normal breath sounds bilaterally, no wheezing, bilateral crackles.  no use of accessory muscles to breath. Cardiovascular: S1, S2 normal. No murmurs, rubs, or gallops.  ABDOMEN: Soft, nontender, nondistended. Bowel sounds present. No organomegaly or mass.  EXTREMITIES: No pedal edema, cyanosis, or clubbing.  NEUROLOGIC: Demented and unable to exam..  PSYCHIATRIC: The patient is demented.  SKIN:  a lot of bruises on upper and lower extremities.    LABORATORY PANEL:   CBC  Recent Labs Lab 03/09/15 0209 03/09/15 0927  WBC 9.2  --   HGB 6.2* 7.9*  HCT 19.0*  --   PLT 206  --    ------------------------------------------------------------------------------------------------------------------  Chemistries   Recent Labs Lab 03/03/15 0655  03/09/15 0209  NA 143  < > 144  K 2.7*  < >  3.4*  CL 109  < > 112*  CO2 26  < > 23  GLUCOSE 102*  < > 163*  BUN 26*  < > 51*  CREATININE 1.08  < > 1.83*  CALCIUM 7.7*  < > 8.9  MG 1.7  --   --   AST  --   --  20  ALT  --   --  12*  ALKPHOS  --   --  48  BILITOT  --   --  0.8  < > = values in this interval not displayed. ------------------------------------------------------------------------------------------------------------------  Cardiac Enzymes  Recent Labs Lab 03/09/15 0209  TROPONINI <0.03   ------------------------------------------------------------------------------------------------------------------  RADIOLOGY:  Dg Chest Portable 1 View  03/09/2015  CLINICAL DATA:  Acute onset of hypoxia. Labored breathing. Initial encounter. EXAM: PORTABLE CHEST 1 VIEW COMPARISON:  Chest radiograph performed 02/28/2015 FINDINGS: Bilateral peripheral opacities are thought to reflect worsening pulmonary fibrosis, though superimposed pneumonia cannot be excluded. No pleural effusion or pneumothorax is seen. The cardiomediastinal silhouette is within normal limits. No acute osseous abnormalities are seen. Bilateral shoulder hemiarthroplasties are grossly unremarkable in appearance. IMPRESSION: Bilateral peripheral opacities are thought reflect worsening pulmonary fibrosis, though superimposed pneumonia cannot be excluded, given the patient's symptoms. Electronically Signed   By: Garald Balding M.D.   On: 03/09/2015 02:10    EKG:   Orders placed or performed during the hospital encounter of 03/09/15  . EKG 12-Lead  . EKG 12-Lead    ASSESSMENT AND PLAN:   1. GI bleed: Likely upper GI tract given the presence of  melena. Continue IV Protonix. Status post PRBC transfusion 2 units.Per Dr. Candace Cruise, no aggressive procedure like EGD, start clear liquid diet and advance as tolerated.  2. Respiratory failure: Acute on chronic; the patient has chronic fibrosis. Continue steroids and supplemental oxygen as needed.  continue Levaquin for  possible superimposed pneumonia.  3. Anemia: Secondary to GI bleed, hemoglobin is up to 7.9 after 2 units PRBC transfusion. Follow-up hemoglobin in a.m.   4. Acute on chronic kidney disease: Likely secondary to GI bleed and dehydration He was on IV fluid support, which was discontinued due to worsening respiratory failure and worsening lung sounds, follow-up BMP.  5. DVT prophylaxis: SCDs  * History of chronic CHF, unclear systolic or diastolic. Get echocardiogram and hold IV fluids.  * Hypokalemia, potassium supplement and follow-up BMP. * Lactic Acidosis. Due to above. Follow-up lactic acid.    I discussed with Dr. Megan Salon and palliative care nurse for possible hospice care or hospice home . All the records are reviewed and case discussed with Care Management/Social Workerr. Management plans discussed with the patient's 2 daughters and they are in agreement. Greater than 50% time was spent on coordination of care and face-to-face counseling. CODE STATUS: DO NOT RESUSCITATE  TOTAL TIME TAKING CARE OF THIS PATIENT: 45 minutes.   POSSIBLE D/C IN 3 DAYS, DEPENDING ON CLINICAL CONDITION.   Demetrios Loll M.D on 03/09/2015 at 5:44 PM  Between 7am to 6pm - Pager - [phone]  After 6pm go to [email or web address] - password [email or web address] [facility]  [facility]  Office  [phone]  CC: Primary care physician; No primary care provider on file.

## 2015-03-09 NOTE — H&P (Addendum)
Aaron Cook is an 81 y.o. male.   Chief Complaint: Respiratory distress HPI: The patient presents to the emergency department via EMS after being found to be in respiratory distress at his nursing home. Nursing home staff also reports that the patient had gross melena in his diaper. Upon arrival to the emergency department the patient was on a nonrebreather mask but had oxygen saturations in the low 90s. This prompted respiratory therapy to initiate BiPAP. Eventually the patient was placed on high flow nasal cannula which she tolerated well. Past history significant for pulmonary fibrosis and recent hospitalization for healthcare associated pneumonia from which she was discharged yesterday. Laboratory evaluation revealed hemoglobin of 6.2 which prompted transfusion of 1 unit packed red blood cells. Due to GI bleed and respiratory distress emergency department staff called for admission.  Past Medical History  Diagnosis Date  . Diabetes mellitus ([facility])   . Incontinence   . Over weight   . BPH (benign prostatic hyperplasia)   . CHF (congestive heart failure) ([facility])   . Pneumonia   . Lung mass   . CAD (coronary artery disease)   . A-fib ([facility])   . HTN (hypertension)   . Anemia, chronic disease   . Hyperlipemia   . Hypothyroid   . Depression   . Prostate carcinoma ([facility])   . Glaucoma     Past Surgical History  Procedure Laterality Date  . Penile prosthesis placement    . Penile prosthesis  removal    . Shoulder replacement    . Replacement total knee      Family History  Problem Relation Age of Onset  . Heart disease Brother   . Heart disease Father    Social History:  reports that he has quit smoking. He does not have any smokeless tobacco history on file. He reports that he does not drink alcohol. His drug history is not on file.  Allergies:  Allergies  Allergen Reactions  . Atropine Other (See Comments)    Reaction: unknown  . Scopolamine Other (See Comments)     Reaction: unknown     (Not in a hospital admission)  Results for orders placed or performed during the hospital encounter of 03/09/15 (from the past 48 hour(s))  Blood gas, venous     Status: Abnormal   Collection Time: 03/09/15  1:47 AM  Result Value Ref Range   FIO2 0.60    Delivery systems BILEVEL POSITIVE AIRWAY PRESSURE    pH, Ven 7.41 7.320 - 7.430   pCO2, Ven 40 (L) 44.0 - 60.0 mmHg   Bicarbonate 25.4 21.0 - 28.0 mEq/L   Acid-Base Excess 0.7 0.0 - 3.0 mmol/L   Patient temperature 37.0    Collection site LINE    Sample type VENOUS   CBC     Status: Abnormal   Collection Time: 03/09/15  2:09 AM  Result Value Ref Range   WBC 9.2 3.8 - 10.6 K/uL   RBC 1.65 (L) 4.40 - 5.90 MIL/uL   Hemoglobin 6.2 (L) 13.0 - 18.0 g/dL   HCT 19.0 (L) 40.0 - 52.0 %   MCV 115.4 (H) 80.0 - 100.0 fL   MCH 37.8 (H) 26.0 - 34.0 pg   MCHC 32.8 32.0 - 36.0 g/dL   RDW 16.4 (H) 11.5 - 14.5 %   Platelets 206 150 - 440 K/uL  Comprehensive metabolic panel     Status: Abnormal   Collection Time: 03/09/15  2:09 AM  Result Value Ref Range   Sodium   144 135 - 145 mmol/L   Potassium 3.4 (L) 3.5 - 5.1 mmol/L   Chloride 112 (H) 101 - 111 mmol/L   CO2 23 22 - 32 mmol/L   Glucose, Bld 163 (H) 65 - 99 mg/dL   BUN 51 (H) 6 - 20 mg/dL   Creatinine, Ser 1.83 (H) 0.61 - 1.24 mg/dL   Calcium 8.9 8.9 - 10.3 mg/dL   Total Protein 6.4 (L) 6.5 - 8.1 g/dL   Albumin 2.4 (L) 3.5 - 5.0 g/dL   AST 20 15 - 41 U/L   ALT 12 (L) 17 - 63 U/L   Alkaline Phosphatase 48 38 - 126 U/L   Total Bilirubin 0.8 0.3 - 1.2 mg/dL   GFR calc non Af Amer 30 (L) >60 mL/min   GFR calc Af Amer 35 (L) >60 mL/min    Comment: (NOTE) The eGFR has been calculated using the CKD EPI equation. This calculation has not been validated in all clinical situations. eGFR's persistently <60 mL/min signify possible Chronic Kidney Disease.    Anion gap 9 5 - 15  Troponin I     Status: None   Collection Time: 03/09/15  2:09 AM  Result Value Ref Range    Troponin I <0.03 <0.031 ng/mL    Comment:        NO INDICATION OF MYOCARDIAL INJURY.   Lactic acid, plasma     Status: Abnormal   Collection Time: 03/09/15  2:09 AM  Result Value Ref Range   Lactic Acid, Venous 2.7 (HH) 0.5 - 2.0 mmol/L    Comment: CRITICAL RESULT CALLED TO, READ BACK BY AND VERIFIED WITH BRYAN GRAY @0305 03/09/15...AJO   Prepare RBC     Status: None   Collection Time: 03/09/15  2:55 AM  Result Value Ref Range   Order Confirmation ORDER PROCESSED BY BLOOD BANK   Type and screen [facility]     Status: None (Preliminary result)   Collection Time: 03/09/15  2:55 AM  Result Value Ref Range   ABO/RH(D) B POS    Antibody Screen NEG    Sample Expiration 03/12/2015    Unit Number [id number]    Blood Component Type RBC CPDA1, LR    Unit division 00    Status of Unit ISSUED    Transfusion Status OK TO TRANSFUSE    Crossmatch Result Compatible    Unit Number [id number]    Blood Component Type RED CELLS,LR    Unit division 00    Status of Unit ALLOCATED    Transfusion Status OK TO TRANSFUSE    Crossmatch Result Compatible    Unit Number [id number]    Blood Component Type RED CELLS,LR    Unit division 00    Status of Unit ALLOCATED    Transfusion Status OK TO TRANSFUSE    Crossmatch Result Compatible    Unit Number [id number]    Blood Component Type RED CELLS,LR    Unit division 00    Status of Unit ALLOCATED    Transfusion Status OK TO TRANSFUSE    Crossmatch Result Compatible   ABO/Rh     Status: None   Collection Time: 03/09/15  3:26 AM  Result Value Ref Range   ABO/RH(D) B POS    Dg Chest Portable 1 View  03/09/2015  CLINICAL DATA:  Acute onset of hypoxia. Labored breathing. Initial encounter. EXAM: PORTABLE CHEST 1 VIEW COMPARISON:  Chest radiograph performed 02/28/2015 FINDINGS: Bilateral peripheral opacities are thought to reflect worsening pulmonary fibrosis,   though superimposed pneumonia cannot be excluded. No  pleural effusion or pneumothorax is seen. The cardiomediastinal silhouette is within normal limits. No acute osseous abnormalities are seen. Bilateral shoulder hemiarthroplasties are grossly unremarkable in appearance. IMPRESSION: Bilateral peripheral opacities are thought reflect worsening pulmonary fibrosis, though superimposed pneumonia cannot be excluded, given the patient's symptoms. Electronically Signed   By: Jeffery  Chang M.D.   On: 03/09/2015 02:10    Review of Systems  Unable to perform ROS: medical condition  Gastrointestinal: Positive for melena (per nursing home).    Blood pressure 93/81, pulse 112, temperature 99.1 F (37.3 C), temperature source Axillary, resp. rate 24, weight 75.796 kg (167 lb 1.6 oz), SpO2 100 %. Physical Exam  Nursing note and vitals reviewed. Constitutional: He appears well-developed and well-nourished. He appears distressed.  HENT:  Head: Normocephalic and atraumatic.  Mouth/Throat: Oropharynx is clear and moist.  Eyes: Conjunctivae and EOM are normal. Pupils are equal, round, and reactive to light. No scleral icterus.  Neck: Normal range of motion. Neck supple. No JVD present. No tracheal deviation present. No thyromegaly present.  Cardiovascular: Regular rhythm and intact distal pulses.  Tachycardia present.  Exam reveals no gallop and no friction rub.   Murmur heard.  Systolic murmur is present with a grade of 3/6  Respiratory: Breath sounds normal. Tachypnea noted. He has no wheezes. He has no rales.  Nasal cannula in place  GI: Soft. Bowel sounds are normal. He exhibits no distension. There is no tenderness.  Genitourinary:  deferred  Musculoskeletal: Normal range of motion. He exhibits no edema.  Lymphadenopathy:    He has no cervical adenopathy.  Neurological: He is alert. No cranial nerve deficit.  Skin: Skin is warm and dry.  Multiple skin tears on all extremities  Psychiatric:  Cannot assess psychological state as the patient has severe  dementia, is moaning and appears to be in mild distress     Assessment/Plan This is a 81-year-old Caucasian male admitted for GI bleed and respiratory failure. 1. GI bleed: Likely upper GI tract given the presence of melena. We're transfusing blood and will have gastroenterology evaluate the patient. He is receiving IV Protonix.  2. Respiratory failure: Acute on chronic; the patient has pulmonary fibrosis. Continue steroids and supplemental oxygen as needed. We will also continue Levaquin for possible superimposed pneumonia. 3. Anemia: Secondary to GI bleed 4. Acute on chronic kidney disease: Likely secondary to GI bleed and dehydration 5. DVT prophylaxis: SCDs 6. GI prophylaxis: As above The patient is a DO NOT RESUSCITATE. Time spent on admission orders and patient care approximate 45 minutes  ,   S 03/09/2015, 4:53 AM    

## 2015-03-09 NOTE — Progress Notes (Signed)
Blood transfusion completed this am at 0900. Vss at this time. Patient changed (bed and gown) and skin assessed with Georgina Quint, RN. Patient has skins tears scattered to bilateral arms and legs. Stage II to sacrum, foam dressing applied. Patient would not eat any applesauce this am, would not attempt to close mouth. Patient has crackles in lungs, fluids stopped and Dr. Bridgett Larsson notified. Ordered for fluids to be stopped and wound and speech to be consulted. Patient's daughter and son at bedside. Updated on plan of care. Wilnette Kales

## 2015-03-09 NOTE — Care Management (Addendum)
Patient is from [facility]. Presented with  gi bleed.  Hemoglobin found to be 6.2 and requiring transfusion.  He was recently discharged from [facility] to [facility] under a private pay long term plan of care with palliative care. Palliative care consult is in progress.  It is anticipated that patient will return to [facility] under hospice plan of care.  This will be discussed with patient's daughter Mardene Celeste who has POA

## 2015-03-10 ENCOUNTER — Encounter: Payer: Self-pay | Admitting: Gastroenterology

## 2015-03-10 DIAGNOSIS — R4182 Altered mental status, unspecified: Secondary | ICD-10-CM

## 2015-03-10 DIAGNOSIS — Z8546 Personal history of malignant neoplasm of prostate: Secondary | ICD-10-CM

## 2015-03-10 DIAGNOSIS — Z515 Encounter for palliative care: Secondary | ICD-10-CM

## 2015-03-10 DIAGNOSIS — E039 Hypothyroidism, unspecified: Secondary | ICD-10-CM

## 2015-03-10 DIAGNOSIS — Z7952 Long term (current) use of systemic steroids: Secondary | ICD-10-CM

## 2015-03-10 DIAGNOSIS — K922 Gastrointestinal hemorrhage, unspecified: Secondary | ICD-10-CM

## 2015-03-10 DIAGNOSIS — E119 Type 2 diabetes mellitus without complications: Secondary | ICD-10-CM

## 2015-03-10 DIAGNOSIS — F329 Major depressive disorder, single episode, unspecified: Secondary | ICD-10-CM

## 2015-03-10 DIAGNOSIS — I4891 Unspecified atrial fibrillation: Secondary | ICD-10-CM

## 2015-03-10 DIAGNOSIS — Z87891 Personal history of nicotine dependence: Secondary | ICD-10-CM

## 2015-03-10 DIAGNOSIS — I10 Essential (primary) hypertension: Secondary | ICD-10-CM

## 2015-03-10 DIAGNOSIS — I2510 Atherosclerotic heart disease of native coronary artery without angina pectoris: Secondary | ICD-10-CM

## 2015-03-10 DIAGNOSIS — R0600 Dyspnea, unspecified: Secondary | ICD-10-CM

## 2015-03-10 DIAGNOSIS — Z79899 Other long term (current) drug therapy: Secondary | ICD-10-CM

## 2015-03-10 DIAGNOSIS — D638 Anemia in other chronic diseases classified elsewhere: Secondary | ICD-10-CM

## 2015-03-10 DIAGNOSIS — E785 Hyperlipidemia, unspecified: Secondary | ICD-10-CM

## 2015-03-10 DIAGNOSIS — R400 Somnolence: Secondary | ICD-10-CM

## 2015-03-10 DIAGNOSIS — R918 Other nonspecific abnormal finding of lung field: Secondary | ICD-10-CM

## 2015-03-10 DIAGNOSIS — R0902 Hypoxemia: Secondary | ICD-10-CM | POA: Insufficient documentation

## 2015-03-10 DIAGNOSIS — I509 Heart failure, unspecified: Secondary | ICD-10-CM

## 2015-03-10 DIAGNOSIS — J189 Pneumonia, unspecified organism: Secondary | ICD-10-CM

## 2015-03-10 DIAGNOSIS — Z66 Do not resuscitate: Secondary | ICD-10-CM

## 2015-03-10 DIAGNOSIS — R32 Unspecified urinary incontinence: Secondary | ICD-10-CM

## 2015-03-10 LAB — CBC
HEMATOCRIT: 23.9 % — AB (ref 40.0–52.0)
HEMOGLOBIN: 8 g/dL — AB (ref 13.0–18.0)
MCH: 34.3 pg — ABNORMAL HIGH (ref 26.0–34.0)
MCHC: 33.6 g/dL (ref 32.0–36.0)
MCV: 102.2 fL — ABNORMAL HIGH (ref 80.0–100.0)
Platelets: 174 10*3/uL (ref 150–440)
RBC: 2.34 MIL/uL — AB (ref 4.40–5.90)
RDW: 27.2 % — ABNORMAL HIGH (ref 11.5–14.5)
WBC: 18.5 10*3/uL — ABNORMAL HIGH (ref 3.8–10.6)

## 2015-03-10 LAB — BASIC METABOLIC PANEL
ANION GAP: 7 (ref 5–15)
BUN: 32 mg/dL — ABNORMAL HIGH (ref 6–20)
CO2: 21 mmol/L — AB (ref 22–32)
Calcium: 7.7 mg/dL — ABNORMAL LOW (ref 8.9–10.3)
Chloride: 125 mmol/L — ABNORMAL HIGH (ref 101–111)
Creatinine, Ser: 1.37 mg/dL — ABNORMAL HIGH (ref 0.61–1.24)
GFR, EST AFRICAN AMERICAN: 50 mL/min — AB (ref >60)
GFR, EST NON AFRICAN AMERICAN: 43 mL/min — AB (ref >60)
GLUCOSE: 95 mg/dL (ref 65–99)
POTASSIUM: 3.3 mmol/L — AB (ref 3.5–5.1)
Sodium: 153 mmol/L — ABNORMAL HIGH (ref 135–145)

## 2015-03-10 LAB — TYPE AND SCREEN
ABO/RH(D): B POS
Antibody Screen: NEGATIVE
UNIT DIVISION: 0
UNIT DIVISION: 0
UNIT DIVISION: 0
Unit division: 0

## 2015-03-10 LAB — MAGNESIUM: Magnesium: 1.4 mg/dL — ABNORMAL LOW (ref 1.7–2.4)

## 2015-03-10 MED ORDER — BISACODYL 10 MG RE SUPP: 10.0000 mg | Freq: Every day | RECTAL | Status: AC | PRN

## 2015-03-10 MED ORDER — MORPHINE SULFATE (CONCENTRATE) 10 MG /0.5 ML PO SOLN: 5.0000 mg | ORAL | Status: AC | PRN

## 2015-03-10 MED ORDER — POTASSIUM CHLORIDE 20 MEQ/15ML (10%) PO SOLN: 40.0000 meq | Freq: Once | ORAL | Status: DC

## 2015-03-10 MED ORDER — LORAZEPAM 2 MG/ML PO CONC: 0.5000 mg | Freq: Four times a day (QID) | ORAL | Status: AC

## 2015-03-10 MED ORDER — ALPRAZOLAM 0.25 MG PO TBDP: 0.2500 mg | ORAL_TABLET | ORAL | Status: DC | PRN

## 2015-03-10 MED ORDER — LORAZEPAM 2 MG/ML IJ SOLN
0.5000 mg | INTRAMUSCULAR | Status: DC | PRN
Start: 2015-03-10 — End: 2015-03-10
  Administered 2015-03-10: 0.5 mg via INTRAVENOUS
  Filled 2015-03-10: qty 1

## 2015-03-10 MED ORDER — GLYCOPYRROLATE 1 MG PO TABS: 1.0000 mg | ORAL_TABLET | Freq: Three times a day (TID) | ORAL | Status: AC | PRN

## 2015-03-10 MED ORDER — ACETAMINOPHEN 325 MG PO TABS: 650.0000 mg | ORAL_TABLET | Freq: Four times a day (QID) | ORAL | Status: AC | PRN

## 2015-03-10 MED ORDER — LORAZEPAM 2 MG/ML PO CONC: 1.0000 mg | ORAL | Status: AC | PRN

## 2015-03-10 MED ORDER — PROCHLORPERAZINE 25 MG RE SUPP: 25.0000 mg | Freq: Three times a day (TID) | RECTAL | Status: AC | PRN

## 2015-03-10 MED ORDER — DEXTROSE 5 % IV SOLN: INTRAVENOUS | Status: DC

## 2015-03-10 MED ORDER — MAGNESIUM SULFATE 2 GM/50ML IV SOLN
2.0000 g | Freq: Once | INTRAVENOUS | Status: AC
  Administered 2015-03-10: 2 g via INTRAVENOUS
  Filled 2015-03-10 (×2): qty 50

## 2015-03-10 NOTE — Progress Notes (Signed)
New referral for [facility] services at [facility] received from [place] following a Palliative Care consult. Mr. [name] was admitted to [facility] from [place] for evaluation and treatment of GI bleed. This is his second admission in the last 2 weeks, last admission 11/7 was for pneumonia. Per chart note review patient has been declining over the past few weeks, he has not been ambulatory since last admission, he has a chronic indwelling foley. This admission he has been treated for his anemia/blood loss and has not improved. Family met with palliative care and have chosen for patient to return to [facility] with hospice to provide comfort EOL/care. Writer met with patient's daughter's [name] and son [name] in the family room to initiate education regarding hospice services, philosophy and team approach to care with good understanding voiced.  Patient seen lying in bed, unresponsive, respirations regular, deep, increased oral secretions noted. Mouth care provided, patient did not respond. Skin with multiple areas of ecchymosis, skin tear to the top of both hands, steri strips intake. Information faxed to referral intake. Plan is for discharge today via EMS back to [facility] with portable DNR in place. Hospital care team made aware. Staff RN tanya to call for transport. Thank you for the opportunity to be involved in the care of this patient.  Flo Shanks RN, BSN, [place] [facility] [place], [facility] [phone]

## 2015-03-10 NOTE — Plan of Care (Signed)
Problem: Education: Goal: Knowledge of [facility] Education information/materials will improve Outcome: Not Progressing Dementia   Problem: Safety: Goal: Ability to remain free from injury will improve Outcome: Progressing Fall precautions in place  Problem: Pain Managment: Goal: General experience of comfort will improve Outcome: Not Progressing IV ativan as needed  Problem: Activity: Goal: Risk for activity intolerance will decrease Outcome: Not Progressing Bed bound today, not alert  Problem: Nutrition: Goal: Adequate nutrition will be maintained Outcome: Not Progressing Not taking anything po

## 2015-03-10 NOTE — Consult Note (Signed)
Palliative Medicine Inpatient Consult Note   Name: Aaron Cook Date: 03/10/2015 MRN: [id number]  DOB: 12-26-1920  Referring Physician: Demetrios Loll, MD  Palliative Care consult requested for this 81 y.o. male for goals of medical therapy in patient with a gi bleed, respiratory distress, and altered mental status.  He has significant other problems as detailed below.  Hospice has assisted in establishing gols of care.  Family wishes for pt to return to facility with Hospice Care.    PLAN: 1.  DNR continues and portable DNR form is in packet.  2.  Family requested routine Ativan for pt.  He is not taking in much --not alert enough to do so. So most of his meds are DCd.  I have completed the Discharge Med Rec list and signed the Rxs after going over each mediation with family.    3.  Pt will be at an ALF facility --a paper DC summary is reviewed by me and included in the packet, as it contains the accurate list of his medications for comfort care as they are to be at the facility.    REVIEW OF SYSTEMS:  Patient is not able to provide ROS due to   [place]: Yes  --family in room.  SOCIAL HISTORY:  reports that he has quit smoking. He does not have any smokeless tobacco history on file. He reports that he does not drink alcohol.  LEGAL DOCUMENTS:  Portable DNR is in paper chart   CODE STATUS: DNR  PAST MEDICAL HISTORY: Past Medical History  Diagnosis Date  . Diabetes mellitus ([place])   . Incontinence   . Over weight   . BPH (benign prostatic hyperplasia)   . CHF (congestive heart failure) ([place])   . Pneumonia   . Lung mass   . CAD (coronary artery disease)   . A-fib ([place])   . HTN (hypertension)   . Anemia, chronic disease   . Hyperlipemia   . Hypothyroid   . Depression   . Prostate carcinoma ([place])   . Glaucoma     PAST SURGICAL HISTORY:  Past Surgical History  Procedure Laterality Date  . Penile prosthesis placement    . Penile prosthesis  removal     . Shoulder replacement    . Replacement total knee      ALLERGIES:  is allergic to atropine and scopolamine.  MEDICATIONS:  Current Facility-Administered Medications  Medication Dose Route Frequency Provider Last Rate Last Dose  . acetaminophen (TYLENOL) tablet 650 mg  650 mg Oral Q4H PRN Harrie Foreman, MD      . ALPRAZolam Duanne Moron) tablet 0.25 mg  0.25 mg Oral Q6H PRN Harrie Foreman, MD      . alum & mag hydroxide-simeth (MAALOX/MYLANTA) 200-200-20 MG/5ML suspension 30 mL  30 mL Oral Q4H PRN Harrie Foreman, MD      . brimonidine (ALPHAGAN) 0.2 % ophthalmic solution 1 drop  1 drop Right Eye BID Harrie Foreman, MD   1 drop at 03/10/15 1030  . cholecalciferol (VITAMIN D) tablet 2,000 Units  2,000 Units Oral Daily Harrie Foreman, MD   2,000 Units at 03/09/15 1000  . cyanocobalamin tablet 1,000 mcg  1,000 mcg Oral Daily Harrie Foreman, MD   1,000 mcg at 03/09/15 1000  . docusate sodium (COLACE) capsule 100 mg  100 mg Oral BID PRN Harrie Foreman, MD      . ferrous sulfate tablet 325 mg  325 mg Oral Q breakfast Legrand Como  Tinnie Gens, MD   325 mg at 03/09/15 0800  . finasteride (PROSCAR) tablet 5 mg  5 mg Oral Daily Harrie Foreman, MD   5 mg at 03/09/15 1000  . fluticasone (FLONASE) 50 MCG/ACT nasal spray 2 spray  2 spray Each Nare Daily Harrie Foreman, MD   2 spray at 03/10/15 1030  . folic acid (FOLVITE) tablet 3 mg  3 mg Oral Daily Harrie Foreman, MD   3 mg at 03/09/15 1000  . guaiFENesin (ROBITUSSIN) 100 MG/5ML solution 200 mg  200 mg Oral Q4H PRN Harrie Foreman, MD      . ipratropium-albuterol (DUONEB) 0.5-2.5 (3) MG/3ML nebulizer solution 3 mL  3 mL Nebulization Q4H PRN Harrie Foreman, MD      . latanoprost (XALATAN) 0.005 % ophthalmic solution 1 drop  1 drop Right Eye QHS Harrie Foreman, MD   1 drop at 03/09/15 2320  . [START ON 03/09/2015] levofloxacin (LEVAQUIN) IVPB 750 mg  750 mg Intravenous Q48H Harrie Foreman, MD      . levothyroxine (SYNTHROID,  LEVOTHROID) tablet 137 mcg  137 mcg Oral QAC breakfast Harrie Foreman, MD   137 mcg at 03/09/15 0800  . LORazepam (ATIVAN) injection 0.5 mg  0.5 mg Intravenous Q4H PRN Demetrios Loll, MD   0.5 mg at 03/10/15 1119  . magnesium hydroxide (MILK OF MAGNESIA) suspension 30 mL  30 mL Oral Daily PRN Harrie Foreman, MD      . Derrill Memo ON 03/15/2015] methotrexate (RHEUMATREX) tablet 10 mg  10 mg Oral Weekly Harrie Foreman, MD      . montelukast (SINGULAIR) tablet 10 mg  10 mg Oral Daily Harrie Foreman, MD   10 mg at 03/09/15 1000  . morphine 2 MG/ML injection 1 mg  1 mg Intravenous Q4H PRN Harrie Foreman, MD   1 mg at 03/09/15 2035  . multivitamin with minerals tablet 1 tablet  1 tablet Oral Daily Harrie Foreman, MD   1 tablet at 03/09/15 1000  . ondansetron (ZOFRAN) tablet 4 mg  4 mg Oral Q6H PRN Harrie Foreman, MD       Or  . ondansetron [facility]) injection 4 mg  4 mg Intravenous Q6H PRN Harrie Foreman, MD      . pantoprazole (PROTONIX) 80 mg in sodium chloride 0.9 % 250 mL (0.32 mg/mL) infusion  8 mg/hr Intravenous Continuous Loney Hering, MD 25 mL/hr at 03/10/15 0214 8 mg/hr at 03/10/15 0214  . [START ON 03/12/2015] pantoprazole (PROTONIX) injection 40 mg  40 mg Intravenous Q12H Loney Hering, MD      . PARoxetine (PAXIL) tablet 20 mg  20 mg Oral Daily Harrie Foreman, MD   20 mg at 03/09/15 1000  . polyvinyl alcohol (LIQUIFILM TEARS) 1.4 % ophthalmic solution 1 drop  1 drop Left Eye BID Harrie Foreman, MD   1 drop at 03/10/15 1030  . polyvinyl alcohol (LIQUIFILM TEARS) 1.4 % ophthalmic solution 1 drop  1 drop Both Eyes BID PRN Harrie Foreman, MD      . potassium chloride (K-DUR) CR tablet 10 mEq  10 mEq Oral Daily Harrie Foreman, MD   10 mEq at 03/09/15 1000  . potassium chloride 20 MEQ/15ML (10%) solution 40 mEq  40 mEq Oral Once Demetrios Loll, MD   40 mEq at 03/10/15 1139  . predniSONE (DELTASONE) tablet 75 mg  75 mg Oral Q breakfast Harrie Foreman, MD  75 mg at  03/09/15 0800  . sodium chloride 0.9 % injection 3 mL  3 mL Intravenous Q12H Harrie Foreman, MD   3 mL at 03/10/15 0708    Vital Signs: BP 107/42 mmHg  Pulse 113  Temp(Src) 98.7 F (37.1 C) (Oral)  Resp 26  Ht 5\' 3"  (1.6 m)  Wt 72.031 kg (158 lb 12.8 oz)  BMI 28.14 kg/m2  SpO2 94% Filed Weights   03/09/15 0158 03/09/15 0654 03/10/15 0351  Weight: 75.796 kg (167 lb 1.6 oz) 73.755 kg (162 lb 9.6 oz) 72.031 kg (158 lb 12.8 oz)    Estimated body mass index is 28.14 kg/(m^2) as calculated from the following:   Height as of this encounter: 5\' 3"  (1.6 m).   Weight as of this encounter: 72.031 kg (158 lb 12.8 oz).  PERFORMANCE STATUS (ECOG) : 4 - Bedbound  PHYSICAL EXAM: Lying in medical bed with his mouth open --has occasional very brief pauses in respirations (brief apnea --rare) Eyes closed Nares patent Lips a little dry Neck w/o JVD or TM Hrt rrr no m Lungs cta  Abd large w/ nl BS Ext no mottling noted   LABS: CBC:    Component Value Date/Time   WBC 18.5* 03/10/2015 0517   WBC 4.0 10/03/2013 0521   HGB 8.0* 03/10/2015 0517   HGB 8.1* 10/06/2013 0441   HCT 23.9* 03/10/2015 0517   HCT 24.6* 10/03/2013 0521   PLT 174 03/10/2015 0517   PLT 145* 10/03/2013 0521   MCV 102.2* 03/10/2015 0517   MCV 115* 10/03/2013 0521   NEUTROABS 10.2* 02/28/2015 1116   NEUTROABS 3.3 10/03/2013 0521   LYMPHSABS 0.4* 02/28/2015 1116   LYMPHSABS 0.6* 10/03/2013 0521   MONOABS 0.2 02/28/2015 1116   MONOABS 0.1* 10/03/2013 0521   EOSABS 0.0 02/28/2015 1116   EOSABS 0.0 10/03/2013 0521   BASOSABS 0.0 02/28/2015 1116   BASOSABS 0.0 10/03/2013 0521   Comprehensive Metabolic Panel:    Component Value Date/Time   NA 153* 03/10/2015 0517   NA 132* 10/06/2013 0441   K 3.3* 03/10/2015 0517   K 3.8 10/03/2013 0521   CL 125* 03/10/2015 0517   CL 97* 10/03/2013 0521   CO2 21* 03/10/2015 0517   CO2 28 10/03/2013 0521   BUN 32* 03/10/2015 0517   BUN 17 10/03/2013 0521   CREATININE  1.37* 03/10/2015 0517   CREATININE 1.12 10/06/2013 0441   GLUCOSE 95 03/10/2015 0517   GLUCOSE 181* 10/03/2013 0521   CALCIUM 7.7* 03/10/2015 0517   CALCIUM 8.2* 10/03/2013 0521   AST 20 03/09/2015 0209   AST 16 10/01/2013 0942   ALT 12* 03/09/2015 0209   ALT 18 10/01/2013 0942   ALKPHOS 48 03/09/2015 0209   ALKPHOS 60 10/01/2013 0942   BILITOT 0.8 03/09/2015 0209   BILITOT 1.0 10/01/2013 0942   PROT 6.4* 03/09/2015 0209   PROT 6.7 10/01/2013 0942   ALBUMIN 2.4* 03/09/2015 0209   ALBUMIN 3.1* 10/01/2013 0942     REFERRALS TO BE ORDERED:  Hospice in the facility   More than 50% of the visit was spent in counseling/coordination of care: Yes  Time Spent: 50  minutes

## 2015-03-10 NOTE — Progress Notes (Signed)
Pt restless, moaning at times.  Ativan 0.5mg  IV given, will monitor.

## 2015-03-10 NOTE — Progress Notes (Signed)
EMS called for non emergent transport to [facility]. 

## 2015-03-10 NOTE — NC FL2 (Addendum)
[place] LEVEL OF CARE SCREENING TOOL     IDENTIFICATION  Patient Name: Aaron Cook Birthdate: 10-08-1920 Sex: male Admission Date (Current Location): 03/09/2015  [place] and [place] Number:  Set designer )   Facility and Address:  [facility], [address], [place], [place] [address]      Provider Number: [id number]  Attending Physician Name and Address:  Demetrios Loll, MD  Relative Name and Phone Number:       Current Level of Care: Hospital Recommended Level of Care: [place] Prior Approval Number:    Date Approved/Denied:   PASRR Number:    Discharge Plan: SNF    Current Diagnoses: Patient Active Problem List   Diagnosis Date Noted  . GI bleed 03/09/2015  . Pressure ulcer 03/01/2015  . HCAP (healthcare-associated pneumonia) 02/28/2015    Orientation ACTIVITIES/SOCIAL BLADDER RESPIRATION         Incontinent O2 (As needed)  BEHAVIORAL SYMPTOMS/MOOD NEUROLOGICAL BOWEL NUTRITION STATUS      Incontinent Diet  PHYSICIAN VISITS COMMUNICATION OF NEEDS Height & Weight Skin    Non-Verbally 5\' 3"  (160 cm) 167 lbs. Skin abrasions, Bruising, PU Stage and Appropriate Care (Buttocks)          AMBULATORY STATUS RESPIRATION    Assist extensive O2 (As needed)      Personal Care Assistance Level of Assistance  Total care Bathing Assistance: Maximum assistance Feeding assistance: Maximum assistance Dressing Assistance: Maximum assistance Total Care Assistance: Maximum assistance    Functional Limitations Info      Hearing Info: Impaired         SPECIAL CARE FACTORS FREQUENCY                      Additional Factors Info  Code Status, Allergies Code Status Info:  (DNR) Allergies Info:  (Atropine, Scopolamine)           Current Medications (03/10/2015): Current Facility-Administered Medications  Medication Dose Route Frequency Provider Last Rate Last Dose  . acetaminophen (TYLENOL)  tablet 650 mg  650 mg Oral Q4H PRN Harrie Foreman, MD      . ALPRAZolam Duanne Moron) tablet 0.25 mg  0.25 mg Oral Q6H PRN Harrie Foreman, MD      . alum & mag hydroxide-simeth (MAALOX/MYLANTA) 200-200-20 MG/5ML suspension 30 mL  30 mL Oral Q4H PRN Harrie Foreman, MD      . brimonidine (ALPHAGAN) 0.2 % ophthalmic solution 1 drop  1 drop Right Eye BID Harrie Foreman, MD   1 drop at 03/10/15 1030  . cholecalciferol (VITAMIN D) tablet 2,000 Units  2,000 Units Oral Daily Harrie Foreman, MD   2,000 Units at 03/09/15 1000  . cyanocobalamin tablet 1,000 mcg  1,000 mcg Oral Daily Harrie Foreman, MD   1,000 mcg at 03/09/15 1000  . docusate sodium (COLACE) capsule 100 mg  100 mg Oral BID PRN Harrie Foreman, MD      . ferrous sulfate tablet 325 mg  325 mg Oral Q breakfast Harrie Foreman, MD   325 mg at 03/09/15 0800  . finasteride (PROSCAR) tablet 5 mg  5 mg Oral Daily Harrie Foreman, MD   5 mg at 03/09/15 1000  . fluticasone (FLONASE) 50 MCG/ACT nasal spray 2 spray  2 spray Each Nare Daily Harrie Foreman, MD   2 spray at 03/10/15 1030  . folic acid (FOLVITE) tablet 3 mg  3 mg Oral Daily Norva Riffle  Marcille Blanco, MD   3 mg at 03/09/15 1000  . guaiFENesin (ROBITUSSIN) 100 MG/5ML solution 200 mg  200 mg Oral Q4H PRN Harrie Foreman, MD      . ipratropium-albuterol (DUONEB) 0.5-2.5 (3) MG/3ML nebulizer solution 3 mL  3 mL Nebulization Q4H PRN Harrie Foreman, MD      . latanoprost (XALATAN) 0.005 % ophthalmic solution 1 drop  1 drop Right Eye QHS Harrie Foreman, MD   1 drop at 03/09/15 2320  . [START ON 03/12/2015] levofloxacin (LEVAQUIN) IVPB 750 mg  750 mg Intravenous Q48H Harrie Foreman, MD      . levothyroxine (SYNTHROID, LEVOTHROID) tablet 137 mcg  137 mcg Oral QAC breakfast Harrie Foreman, MD   137 mcg at 03/09/15 0800  . LORazepam (ATIVAN) injection 0.5 mg  0.5 mg Intravenous Q4H PRN Demetrios Loll, MD   0.5 mg at 03/10/15 1119  . magnesium hydroxide (MILK OF MAGNESIA) suspension  30 mL  30 mL Oral Daily PRN Harrie Foreman, MD      . magnesium sulfate IVPB 2 g 50 mL  2 g Intravenous Once Demetrios Loll, MD   2 g at 03/10/15 1135  . [START ON 03/15/2015] methotrexate (RHEUMATREX) tablet 10 mg  10 mg Oral Weekly Harrie Foreman, MD      . montelukast (SINGULAIR) tablet 10 mg  10 mg Oral Daily Harrie Foreman, MD   10 mg at 03/09/15 1000  . morphine 2 MG/ML injection 1 mg  1 mg Intravenous Q4H PRN Harrie Foreman, MD   1 mg at 03/09/15 2035  . multivitamin with minerals tablet 1 tablet  1 tablet Oral Daily Harrie Foreman, MD   1 tablet at 03/09/15 1000  . ondansetron (ZOFRAN) tablet 4 mg  4 mg Oral Q6H PRN Harrie Foreman, MD       Or  . ondansetron [facility]) injection 4 mg  4 mg Intravenous Q6H PRN Harrie Foreman, MD      . pantoprazole (PROTONIX) 80 mg in sodium chloride 0.9 % 250 mL (0.32 mg/mL) infusion  8 mg/hr Intravenous Continuous Loney Hering, MD 25 mL/hr at 03/10/15 0214 8 mg/hr at 03/10/15 0214  . [START ON 03/12/2015] pantoprazole (PROTONIX) injection 40 mg  40 mg Intravenous Q12H Loney Hering, MD      . PARoxetine (PAXIL) tablet 20 mg  20 mg Oral Daily Harrie Foreman, MD   20 mg at 03/09/15 1000  . polyvinyl alcohol (LIQUIFILM TEARS) 1.4 % ophthalmic solution 1 drop  1 drop Left Eye BID Harrie Foreman, MD   1 drop at 03/10/15 1030  . polyvinyl alcohol (LIQUIFILM TEARS) 1.4 % ophthalmic solution 1 drop  1 drop Both Eyes BID PRN Harrie Foreman, MD      . potassium chloride (K-DUR) CR tablet 10 mEq  10 mEq Oral Daily Harrie Foreman, MD   10 mEq at 03/09/15 1000  . potassium chloride 20 MEQ/15ML (10%) solution 40 mEq  40 mEq Oral Once Demetrios Loll, MD   40 mEq at 03/10/15 1139  . predniSONE (DELTASONE) tablet 75 mg  75 mg Oral Q breakfast Harrie Foreman, MD   75 mg at 03/09/15 0800  . sodium chloride 0.9 % injection 3 mL  3 mL Intravenous Q12H Harrie Foreman, MD   3 mL at 03/10/15 0708   Do not use this list as official medication  orders. Please verify with discharge summary.  Discharge Medications:  Medication List    STOP taking these medications        alendronate 70 MG tablet  Commonly known as:  FOSAMAX     amoxicillin-clavulanate 875-125 MG tablet  Commonly known as:  AUGMENTIN     CALCIUM 500 PO     Cholecalciferol 5000 UNITS capsule     docusate sodium 100 MG capsule  Commonly known as:  COLACE     Ergocalciferol 2000 UNITS Tabs     ferrous sulfate 325 (65 FE) MG tablet     finasteride 5 MG tablet  Commonly known as:  PROSCAR     fluticasone 50 MCG/ACT nasal spray  Commonly known as:  FLONASE     folic acid 1 MG tablet  Commonly known as:  FOLVITE     furosemide 20 MG tablet  Commonly known as:  LASIX     ipratropium-albuterol 0.5-2.5 (3) MG/3ML Soln  Commonly known as:  DUONEB     latanoprost 0.005 % ophthalmic solution  Commonly known as:  XALATAN     magnesium hydroxide 400 MG/5ML suspension  Commonly known as:  MILK OF MAGNESIA     methotrexate 2.5 MG tablet  Commonly known as:  RHEUMATREX     montelukast 10 MG tablet  Commonly known as:  SINGULAIR     MYLANTA [id number] MG/5ML suspension  Generic drug:  alum & mag hydroxide-simeth     omeprazole 20 MG capsule  Commonly known as:  PRILOSEC     PARoxetine 20 MG tablet  Commonly known as:  PAXIL     potassium chloride 10 MEQ tablet  Commonly known as:  K-DUR     predniSONE 5 MG tablet  Commonly known as:  DELTASONE     SYSTANE BALANCE 0.6 % Soln  Generic drug:  Propylene Glycol     vitamin B-12 500 MCG tablet  Commonly known as:  CYANOCOBALAMIN      TAKE these medications        acetaminophen 325 MG tablet  Commonly known as:  TYLENOL  Take 2 tablets (650 mg total) by mouth every 6 (six) hours as needed for mild pain, fever or headache.     ALPRAZolam 0.25 MG dissolvable tablet  Commonly known as:  NIRAVAM  Take 1 tablet (0.25 mg total) by mouth every 4 (four) hours as needed for anxiety.      bisacodyl 10 MG suppository  Commonly known as:  DULCOLAX  Place 1 suppository (10 mg total) rectally daily as needed for moderate constipation.     brimonidine 0.2 % ophthalmic solution  Commonly known as:  ALPHAGAN  Place 1 drop into the right eye 2 (two) times daily.     glycopyrrolate 1 MG tablet  Commonly known as:  ROBINUL  Take 1 tablet (1 mg total) by mouth 3 (three) times daily as needed (excessive secretions).     guaiFENesin 100 MG/5ML liquid  Commonly known as:  ROBITUSSIN  Take 200 mg by mouth every 4 (four) hours as needed for cough.     hydroxypropyl methylcellulose / hypromellose 2.5 % ophthalmic solution  Commonly known as:  ISOPTO TEARS / GONIOVISC  Place 1 drop into both eyes 2 (two) times daily as needed for dry eyes (itchy eyes).     levothyroxine 137 MCG tablet  Commonly known as:  SYNTHROID, LEVOTHROID  Take 137 mcg by mouth daily before breakfast.     morphine CONCENTRATE 10 mg / 0.5 ml concentrated solution  Take 0.25 mLs (5 mg  total) by mouth every 2 (two) hours as needed for moderate pain or shortness of breath.     prochlorperazine 25 MG suppository  Commonly known as:  COMPAZINE  Place 1 suppository (25 mg total) rectally every 8 (eight) hours as needed for nausea or vomiting.        Relevant Imaging Results:  Relevant Lab Results:  Recent Labs    Additional Information  [id number])  Maurine Cane, LCSW

## 2015-03-10 NOTE — Consult Note (Signed)
GI Inpatient Follow-up Note  Patient Identification: Aaron Cook is a 81 y.o. male with anemia and melena.  Subjective: Breathing appear more labored today.  No active bleeding. Gurgling. Not able to swallow much last night. Scheduled Inpatient Medications:  . brimonidine  1 drop Right Eye BID  . cholecalciferol  2,000 Units Oral Daily  . cyanocobalamin  1,000 mcg Oral Daily  . ferrous sulfate  325 mg Oral Q breakfast  . finasteride  5 mg Oral Daily  . fluticasone  2 spray Each Nare Daily  . folic acid  3 mg Oral Daily  . latanoprost  1 drop Right Eye QHS  . [START ON 03/21/2015] levofloxacin (LEVAQUIN) IV  750 mg Intravenous Q48H  . levothyroxine  137 mcg Oral QAC breakfast  . [START ON 03/15/2015] methotrexate  10 mg Oral Weekly  . montelukast  10 mg Oral Daily  . multivitamin with minerals  1 tablet Oral Daily  . [START ON 03/12/2015] pantoprazole (PROTONIX) IV  40 mg Intravenous Q12H  . PARoxetine  20 mg Oral Daily  . polyvinyl alcohol  1 drop Left Eye BID  . potassium chloride  10 mEq Oral Daily  . potassium chloride  40 mEq Oral Once  . predniSONE  75 mg Oral Q breakfast  . sodium chloride  3 mL Intravenous Q12H    Continuous Inpatient Infusions:   . pantoprozole (PROTONIX) infusion 8 mg/hr (03/10/15 0214)    PRN Inpatient Medications:  acetaminophen, ALPRAZolam, alum & mag hydroxide-simeth, docusate sodium, guaiFENesin, ipratropium-albuterol, LORazepam, magnesium hydroxide, morphine injection, ondansetron **OR** ondansetron (ZOFRAN) IV, polyvinyl alcohol  Review of Systems: Constitutional: Weight is stable.  Eyes: No changes in vision. ENT: No oral lesions, sore throat.  GI: see HPI.  Heme/Lymph: No easy bruising.  CV: No chest pain.  GU: No hematuria.  Integumentary: No rashes.  Neuro: No headaches.  Psych: No depression/anxiety.  Endocrine: No heat/cold intolerance.  Allergic/Immunologic: No urticaria.  Resp: No cough, SOB.  Musculoskeletal: No  joint swelling.    Physical Examination: BP 107/42 mmHg  Pulse 113  Temp(Src) 98.7 F (37.1 C) (Oral)  Resp 26  Ht 5\' 3"  (1.6 m)  Wt 72.031 kg (158 lb 12.8 oz)  BMI 28.14 kg/m2  SpO2 94% Gen: NAD, alert and oriented x 4 HEENT: [place], EOMI, Neck: supple, no JVD or thyromegaly Chest: Rhonchi bilaterally CV: RRR, no m/g/c/r Abd: soft, NT, ND, +BS in all four quadrants; no HSM, guarding, ridigity, or rebound tenderness Ext: no edema, well perfused with 2+ pulses, Skin: no rash or lesions noted Lymph: no LAD  Data: Lab Results  Component Value Date   WBC 18.5* 03/10/2015   HGB 8.0* 03/10/2015   HCT 23.9* 03/10/2015   MCV 102.2* 03/10/2015   PLT 174 03/10/2015    Recent Labs Lab 03/09/15 0209 03/09/15 0927 03/10/15 0517  HGB 6.2* 7.9* 8.0*   Lab Results  Component Value Date   NA 153* 03/10/2015   K 3.3* 03/10/2015   CL 125* 03/10/2015   CO2 21* 03/10/2015   BUN 32* 03/10/2015   CREATININE 1.37* 03/10/2015   Lab Results  Component Value Date   ALT 12* 03/09/2015   AST 20 03/09/2015   ALKPHOS 48 03/09/2015   BILITOT 0.8 03/09/2015   No results for input(s): APTT, INR, PTT in the last 168 hours. Assessment/Plan: Mr. Maupin is a 81 y.o. male with recent GI bleeding. No active bleeding now. Has pneumonia and pulmonary fibrosis.   Recommendations: Continue PPI daily. Transfusion  as needed. With his mental status, pt at risk for aspiration if pt takes any po. Family leaning towards hospice. Will sign off. Please call with questions or concerns.  [employer]is[employer], Lupita Dawn, MD

## 2015-03-10 NOTE — Progress Notes (Signed)
Resting more comfortably, no moaning at this time.

## 2015-03-10 NOTE — Discharge Instructions (Signed)
Comfort care  °

## 2015-03-10 NOTE — Discharge Summary (Addendum)
[facility] NAME: Aaron Cook    MR#:  [id number]  DATE OF BIRTH:  May 05, 1920  DATE OF ADMISSION:  03/09/2015 ADMITTING PHYSICIAN: Harrie Foreman, MD  DATE OF DISCHARGE:  03/10/2015 PRIMARY CARE PHYSICIAN: No primary care provider on file.    ADMISSION DIAGNOSIS:  Transient alteration of awareness [R40.4] Respiratory distress [R06.00] Hypoxia [R09.02] Healthcare-associated pneumonia [J18.9] Gastrointestinal hemorrhage associated with gastritis, unspecified gastritis [K29.71]   DISCHARGE DIAGNOSIS:  GI bleeding Acute on chronic Respiratory failure Acute on chronic kidney disease Hypernatremia Lactic Acidosis. SECONDARY DIAGNOSIS:   Past Medical History  Diagnosis Date  . Diabetes mellitus ([place])   . Incontinence   . Over weight   . BPH (benign prostatic hyperplasia)   . CHF (congestive heart failure) ([place])   . Pneumonia   . Lung mass   . CAD (coronary artery disease)   . A-fib ([place])   . HTN (hypertension)   . Anemia, chronic disease   . Hyperlipemia   . Hypothyroid   . Depression   . Prostate carcinoma ([place])   . Glaucoma     HOSPITAL COURSE:   1. GI bleed: Likely upper GI tract given the presence of melena. No active bleeding after admission. He was treated with IV Protonix. Status post PRBC transfusion 2 units.Per Dr. Candace Cruise, no aggressive procedure like EGD, started clear liquid diet and advance as tolerated.  2. Respiratory failure: Acute on chronic; the patient has chronic fibrosis.  He has been treated with steroids and supplemental oxygen as needed. continue Levaquin for possible superimposed pneumonia.  3. Anemia: Secondary to GI bleed, hemoglobin is up to 7.9 after 2 units PRBC transfusion. Hb is stable.  4. Acute on chronic kidney disease: Likely secondary to GI bleed and dehydration He was on IV fluid support, which was discontinued due to worsening respiratory failure and worsening lung  sounds.  5. DVT prophylaxis: on SCDs  * History of chronic diastolic CHF, EF [id number] per echocardiogram.  * Hypokalemia, potassium supplement given. * Lactic Acidosis. Due to above.  * Hypernatremia. Na 153.   Discussed with family members, Dr. Megan Salon, palliative care nurse. The patient has very poor prognosis and needs hospice care.  DISCHARGE CONDITIONS:   Very poor prognosis, discharge to SNF with hospice care today.  CONSULTS OBTAINED:  Treatment Team:  Hulen Luster, MD  DRUG ALLERGIES:   Allergies  Allergen Reactions  . Atropine Other (See Comments)    Reaction: unknown  . Scopolamine Other (See Comments)    Reaction: unknown    DISCHARGE MEDICATIONS:   Current Discharge Medication List    START taking these medications   Details  bisacodyl (DULCOLAX) 10 MG suppository Place 1 suppository (10 mg total) rectally daily as needed for moderate constipation. Qty: 12 suppository, Refills: 0    glycopyrrolate (ROBINUL) 1 MG tablet Take 1 tablet (1 mg total) by mouth 3 (three) times daily as needed (excessive secretions). Qty: 20 tablet, Refills: 0    !! LORazepam (ATIVAN) 2 MG/ML concentrated solution Take 0.3 mLs (0.6 mg total) by mouth every 6 (six) hours. Qty: 30 mL, Refills: 0    !! LORazepam (ATIVAN) 2 MG/ML concentrated solution Take 0.5 mLs (1 mg total) by mouth every 4 (four) hours as needed (severe anxiety or restless ness). Qty: 30 mL, Refills: 0    Morphine Sulfate (MORPHINE CONCENTRATE) 10 mg / 0.5 ml concentrated solution Take 0.25 mLs (5 mg total) by mouth every 2 (  two) hours as needed for moderate pain or shortness of breath. Qty: 30 mL, Refills: 0    prochlorperazine (COMPAZINE) 25 MG suppository Place 1 suppository (25 mg total) rectally every 8 (eight) hours as needed for nausea or vomiting. Qty: 6 suppository, Refills: 0     !! - Potential duplicate medications found. Please discuss with provider.    CONTINUE these medications which have CHANGED    Details  acetaminophen (TYLENOL) 325 MG tablet Take 2 tablets (650 mg total) by mouth every 6 (six) hours as needed for mild pain, fever or headache.      CONTINUE these medications which have NOT CHANGED   Details  brimonidine (ALPHAGAN) 0.2 % ophthalmic solution Place 1 drop into the right eye 2 (two) times daily.     hydroxypropyl methylcellulose / hypromellose (ISOPTO TEARS / GONIOVISC) 2.5 % ophthalmic solution Place 1 drop into both eyes 2 (two) times daily as needed for dry eyes (itchy eyes).      STOP taking these medications     alendronate (FOSAMAX) 70 MG tablet      alum & mag hydroxide-simeth (MYLANTA) 200-200-20 MG/5ML suspension      amoxicillin-clavulanate (AUGMENTIN) 875-125 MG tablet      Calcium-Magnesium-Vitamin D (CALCIUM 500 PO)      Cholecalciferol 5000 UNITS capsule      docusate sodium (COLACE) 100 MG capsule      Ergocalciferol 2000 UNITS TABS      ferrous sulfate 325 (65 FE) MG tablet      finasteride (PROSCAR) 5 MG tablet      fluticasone (FLONASE) 50 MCG/ACT nasal spray      folic acid (FOLVITE) 1 MG tablet      furosemide (LASIX) 20 MG tablet      guaiFENesin (ROBITUSSIN) 100 MG/5ML liquid      ipratropium-albuterol (DUONEB) 0.5-2.5 (3) MG/3ML SOLN      latanoprost (XALATAN) 0.005 % ophthalmic solution      levothyroxine (SYNTHROID, LEVOTHROID) 137 MCG tablet      magnesium hydroxide (MILK OF MAGNESIA) 400 MG/5ML suspension      methotrexate (RHEUMATREX) 2.5 MG tablet      montelukast (SINGULAIR) 10 MG tablet      omeprazole (PRILOSEC) 20 MG capsule      PARoxetine (PAXIL) 20 MG tablet      potassium chloride (K-DUR) 10 MEQ tablet      predniSONE (DELTASONE) 5 MG tablet      Propylene Glycol (SYSTANE BALANCE) 0.6 % SOLN      vitamin B-12 (CYANOCOBALAMIN) 500 MCG tablet      ALPRAZolam (NIRAVAM) 0.25 MG dissolvable tablet          DISCHARGE INSTRUCTIONS:    If you experience worsening of your admission symptoms,  develop shortness of breath, life threatening emergency, suicidal or homicidal thoughts you must seek medical attention immediately by calling 911 or calling your MD immediately  if symptoms less severe.  You Must read complete instructions/literature along with all the possible adverse reactions/side effects for all the Medicines you take and that have been prescribed to you. Take any new Medicines after you have completely understood and accept all the possible adverse reactions/side effects.   Please note  You were cared for by a hospitalist during your hospital stay. If you have any questions about your discharge medications or the care you received while you were in the hospital after you are discharged, you can call the unit and asked to speak with the hospitalist on  call if the hospitalist that took care of you is not available. Once you are discharged, your primary care physician will handle any further medical issues. Please note that NO REFILLS for any discharge medications will be authorized once you are discharged, as it is imperative that you return to your primary care physician (or establish a relationship with a primary care physician if you do not have one) for your aftercare needs so that they can reassess your need for medications and monitor your lab values.    Today   SUBJECTIVE    The patient is demented and non-communicative.  VITAL SIGNS:  Blood pressure 107/42, pulse 113, temperature 98.7 F (37.1 C), temperature source Oral, resp. rate 26, height 5\' 3"  (1.6 m), weight 72.031 kg (158 lb 12.8 oz), SpO2 94 %.  I/O:   Intake/Output Summary (Last 24 hours) at 03/10/15 1311 Last data filed at 03/10/15 1235  Gross per 24 hour  Intake 680.42 ml  Output   1050 ml  Net -369.58 ml    PHYSICAL EXAMINATION:  GENERAL:  81 y.o.-year-old patient lying in the bed with O2 EYES: No scleral icterus.  HEENT: Head atraumatic, normocephalic. Opening mouth. Dry oral mucosa. NECK:   Supple, no jugular venous distention. No thyroid enlargement, no tenderness.  LUNGS: Normal breath sounds bilaterally, no wheezing, but has crackles. Mild use of accessory muscles of respiration.  CARDIOVASCULAR: S1, S2 normal. No murmurs, rubs, or gallops.  ABDOMEN: Soft, non-tender, non-distended. Bowel sounds present. No organomegaly or mass.  EXTREMITIES: No pedal edema, cyanosis, or clubbing.  NEUROLOGIC: unable to exam. PSYCHIATRIC: The patient is demented. SKIN: bruises in arms and legs.  DATA REVIEW:   CBC  Recent Labs Lab 03/10/15 0517  WBC 18.5*  HGB 8.0*  HCT 23.9*  PLT 174    Chemistries   Recent Labs Lab 03/09/15 0209 03/10/15 0517  NA 144 153*  K 3.4* 3.3*  CL 112* 125*  CO2 23 21*  GLUCOSE 163* 95  BUN 51* 32*  CREATININE 1.83* 1.37*  CALCIUM 8.9 7.7*  MG  --  1.4*  AST 20  --   ALT 12*  --   ALKPHOS 48  --   BILITOT 0.8  --     Cardiac Enzymes  Recent Labs Lab 03/09/15 0209  TROPONINI <0.03    Microbiology Results  Results for orders placed or performed during the hospital encounter of 03/09/15  Blood culture (routine x 2)     Status: None (Preliminary result)   Collection Time: 03/09/15  2:09 AM  Result Value Ref Range Status   Specimen Description BLOOD  Final   Special Requests Normal  Final   Culture NO GROWTH 1 DAY  Final   Report Status PENDING  Incomplete  Blood culture (routine x 2)     Status: None (Preliminary result)   Collection Time: 03/09/15  2:11 AM  Result Value Ref Range Status   Specimen Description BLOOD  Final   Special Requests Normal  Final   Culture NO GROWTH 1 DAY  Final   Report Status PENDING  Incomplete  MRSA PCR Screening     Status: None   Collection Time: 03/09/15  6:56 AM  Result Value Ref Range Status   MRSA by PCR NEGATIVE NEGATIVE Final    Comment:        The GeneXpert MRSA Assay (FDA approved for NASAL specimens only), is one component of a comprehensive MRSA colonization surveillance  program. It is not intended to diagnose  MRSA infection nor to guide or monitor treatment for MRSA infections.     RADIOLOGY:  Dg Chest Portable 1 View  03/09/2015  CLINICAL DATA:  Acute onset of hypoxia. Labored breathing. Initial encounter. EXAM: PORTABLE CHEST 1 VIEW COMPARISON:  Chest radiograph performed 02/28/2015 FINDINGS: Bilateral peripheral opacities are thought to reflect worsening pulmonary fibrosis, though superimposed pneumonia cannot be excluded. No pleural effusion or pneumothorax is seen. The cardiomediastinal silhouette is within normal limits. No acute osseous abnormalities are seen. Bilateral shoulder hemiarthroplasties are grossly unremarkable in appearance. IMPRESSION: Bilateral peripheral opacities are thought reflect worsening pulmonary fibrosis, though superimposed pneumonia cannot be excluded, given the patient's symptoms. Electronically Signed   By: Garald Balding M.D.   On: 03/09/2015 02:10      I discussed with Dr. Megan Salon and palliative care nurse.  Management plans discussed with the patient's daughter and son and they are in agreement.  CODE STATUS:     Code Status Orders        Start     Ordered   03/09/15 [phone]  Do not attempt resuscitation (DNR)   Continuous    Question Answer Comment  In the event of cardiac or respiratory ARREST Do not call a "code blue"   In the event of cardiac or respiratory ARREST Do not perform Intubation, CPR, defibrillation or ACLS   In the event of cardiac or respiratory ARREST Use medication by any route, position, wound care, and other measures to relive pain and suffering. May use oxygen, suction and manual treatment of airway obstruction as needed for comfort.      03/09/15 0655    Advance Directive Documentation        Most Recent Value   Type of Advance Directive  Out of facility DNR (pink MOST or yellow form)   Pre-existing out of facility DNR order (yellow form or pink MOST form)     "MOST" Form in Place?         TOTAL TIME TAKING CARE OF THIS PATIENT: 37 minutes.    Demetrios Loll M.D on 03/10/2015 at 1:11 PM  Between 7am to 6pm - Pager - [phone]  After 6pm go to [email or web address] - password [email or web address] [facility]  [facility]  Office  [phone]  CC: Primary care physician; No primary care provider on file.

## 2015-03-10 NOTE — Progress Notes (Signed)
Report called to [facility], spoke with Early Osmond, RN

## 2015-03-10 NOTE — Progress Notes (Signed)
EMS here to transport pt to [facility]

## 2015-03-10 NOTE — Clinical Social Work Note (Signed)
Clinical Social Work Assessment  Patient Details  Name: Aaron Cook MRN: [id number] Date of Birth: 10/01/1920  Date of referral:  03/10/15               Reason for consult:   (from [facility])                Permission sought to share information with:  Facility Sport and exercise psychologist, Family Supports Permission granted to share information::     Name::      (daughter Fraser Din  [phone])  Agency::     Relationship::     Contact Information:     Housing/Transportation Living arrangements for the past 2 months:  [place] of Information:  Adult Children, Facility, Scientist, water quality, Power of [place], Palliative Care Team Patient Interpreter Needed:  None Criminal Activity/Legal Involvement Pertinent to Current Situation/Hospitalization:    Significant Relationships:  Adult Children Lives with:  Facility Resident Do you feel safe going back to the place where you live?    Need for family participation in patient care:  Yes (Comment)  Care giving concerns:  Patient will return to SNF with Hospice    Social Worker assessment / plan:  Clinical Social Worker (CSW) consult, patient is from [facility].  Patient was not alert and oriented.  (Information provided by patient's daughter Fraser Din ).  CSW introduced self and explained role of CSW department Patient currently lives at [facility] and plans to return at discharge with [facility] to follow.   Patient's support system is his three adult children.     CSW will complete FL2, and contact facility in anticipation of patient returning to [facility] at discharge.       Employment status:  Disabled (Comment on whether or not currently receiving Disability) Insurance information:  Medicare PT Recommendations:  [place] (with Hospice) Information / Referral to community resources:  [place]  Patient/Family's Response to care:  Patient's family was appreciative  of information provided by CSW and will meet with Hospice .   Patient/Family's Understanding of and Emotional Response to Diagnosis, Current Treatment, and Prognosis: Patient's daughter understands that patient will discharge back to [facility] with Hospice.  Emotional Assessment Appearance:  Appears stated age Attitude/Demeanor/Rapport:  Unable to Assess Affect (typically observed):  Unable to Assess Orientation:  Oriented to Self Alcohol / Substance use:    Psych involvement (Current and /or in the community):  No (Comment)  Discharge Needs  Concerns to be addressed:  Care Coordination, Discharge Planning Concerns Readmission within the last 30 days:  Yes Current discharge risk:  Chronically ill, Dependent with Mobility Barriers to Discharge:  Continued Medical Work up, No Barriers Identified   Maurine Cane, LCSW 03/10/2015, 12:23 PM

## 2015-03-10 NOTE — Progress Notes (Signed)
Clinical Social Worker informed by Demetrios Loll, MD that patient is medically ready to discharge to SNF with Hospice, Patient's family  are in a agreement with plan. CSW spoke to daughter Fraser Din.  Call to [facility] to confirm that patient's bed is ready. Provided patient's room number 105 and number to call for report [phone]    All discharge information faxed to Facility. Rx's added to discharge packet with DNR.   RN will call report and patient will discharge to [facility] via EMS.  Casimer Lanius. LCSWA, MSW Clinical Social Work Department [phone] 1:40 PM

## 2015-03-10 NOTE — Care Management (Signed)
Informed that patient is to discharge today back to [facility] under hospice care.

## 2015-03-10 NOTE — Consult Note (Signed)
Palliative Care Consult for goals of care. Met with patient's 2 daughters Rosann Auerbach and Fraser Din [facility]) and patient's son [name]. Reviewed patient's current status and family verbalizes understanding of patient's poor condition and grim prognosis. Family desires comfort care only at this time and wishes to discontinue blood transfusions as their desire is for him to return to [facility] with Hospice care. Patient is a DNR. Explained choice and family has chosen [employer], [employer]. Flo Shanks, RN notified and will plan to meet with patient to discuss further. Discussed with Lilia Pro, RN care manager to update with this information. Also paged Dr.Chen and informed him of this plan. He agrees to do discharge summary and Dr. Megan Salon to do discharge medications and orders. All in agreement with this plan.

## 2015-03-16 LAB — CULTURE, BLOOD (ROUTINE X 2)
CULTURE: NO GROWTH
Culture: NO GROWTH
SPECIAL REQUESTS: NORMAL
SPECIAL REQUESTS: NORMAL

## 2015-03-24 DEATH — deceased

## 2017-02-21 IMAGING — CR DG CHEST 1V PORT
1 series · 1 of 1 positions shown · non-contrast
Comparison: January 21, 2015

CLINICAL DATA: Fever and weakness; hypoxia

EXAM:
PORTABLE CHEST 1 VIEW

[ap]
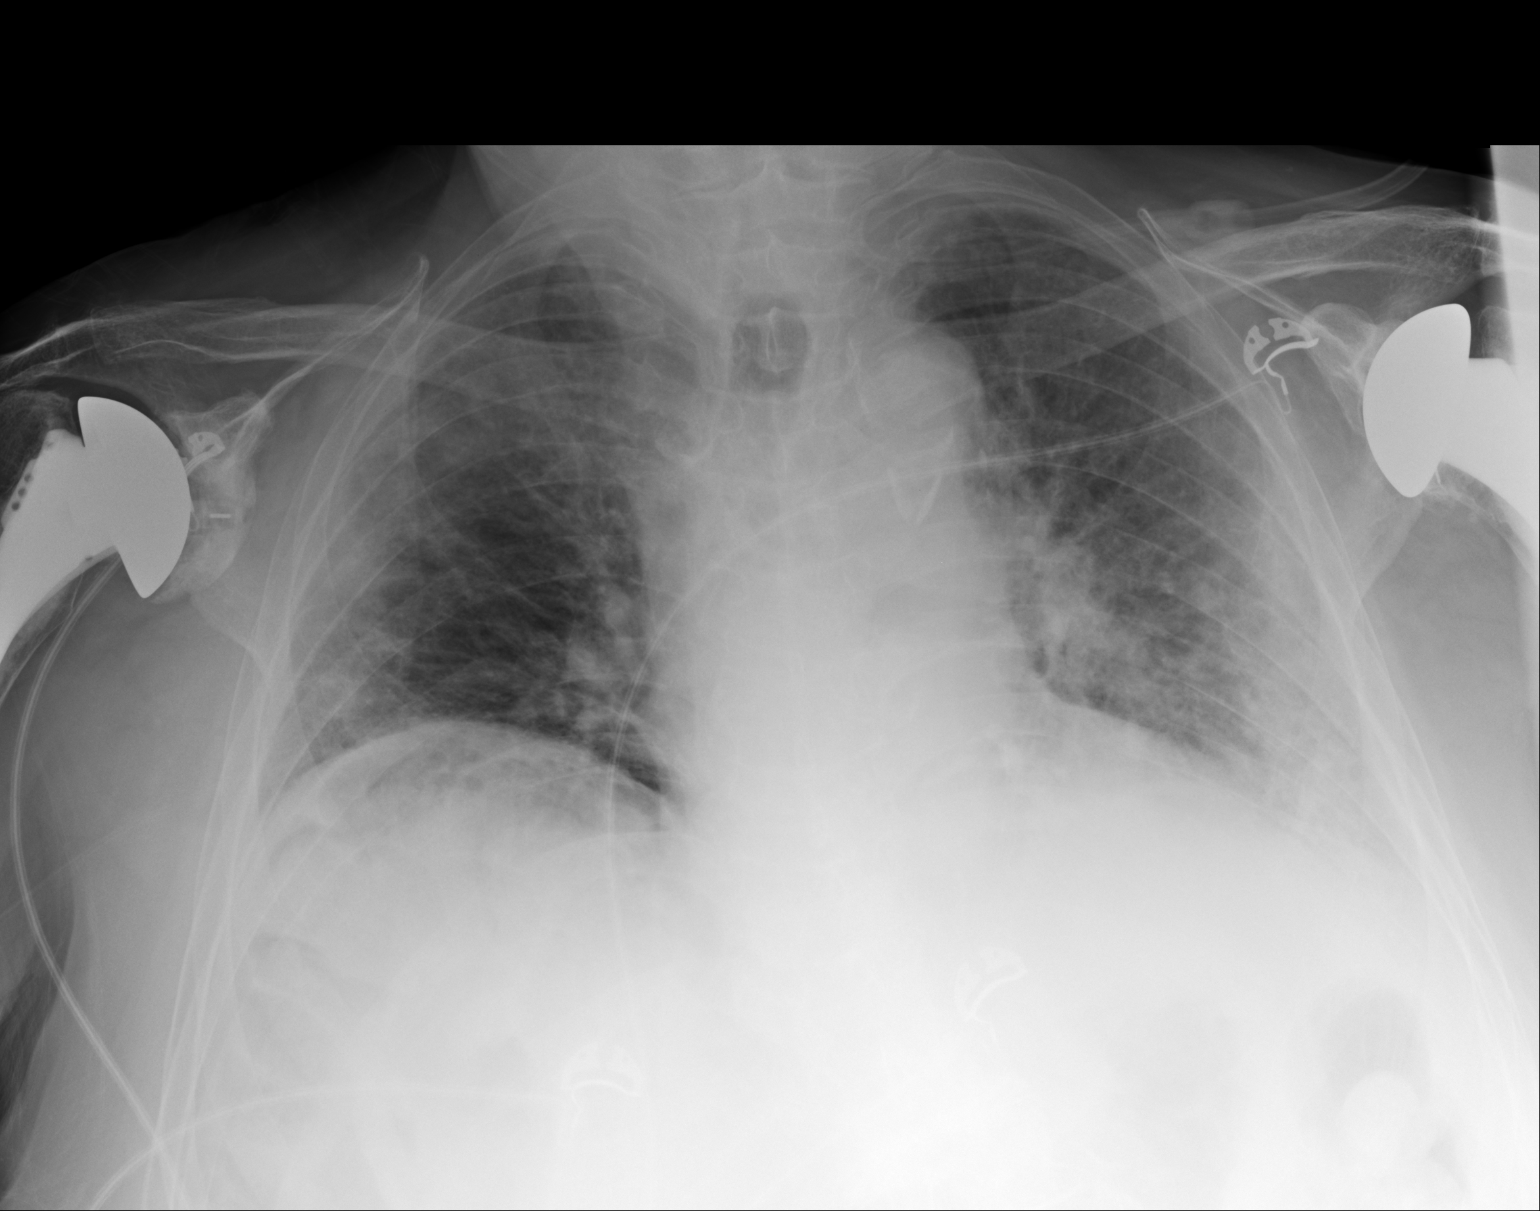

[1 of 1 positions shown; findings below may reference images not displayed]

FINDINGS: There is patchy airspace consolidation in both lower lobes, more on
the left than on the right. Lungs elsewhere are clear. The heart is
upper normal in size with pulmonary vascularity within normal
limits. There is atherosclerotic calcification in the aorta. Patient
is status post bilateral total shoulder replacements.
IMPRESSION: Areas of airspace consolidation bilaterally, more on the left than
on the right, likely pneumonia, although atypical pulmonary edema
could present similarly. Lungs elsewhere clear. Cardiac silhouette
within normal limits and stable. Atherosclerotic calcification
noted.
